# Patient Record
Sex: Female | Born: 1950
Health system: Southern US, Community
[De-identification: ages and names within clinical notes are randomized; demographics above are authoritative.]

## PROBLEM LIST (undated history)

## (undated) DIAGNOSIS — L309 Dermatitis, unspecified: Secondary | ICD-10-CM

## (undated) DIAGNOSIS — J449 Chronic obstructive pulmonary disease, unspecified: Secondary | ICD-10-CM

## (undated) DIAGNOSIS — K635 Polyp of colon: Secondary | ICD-10-CM

## (undated) DIAGNOSIS — I10 Essential (primary) hypertension: Secondary | ICD-10-CM

## (undated) DIAGNOSIS — J302 Other seasonal allergic rhinitis: Secondary | ICD-10-CM

## (undated) DIAGNOSIS — F419 Anxiety disorder, unspecified: Secondary | ICD-10-CM

## (undated) DIAGNOSIS — E785 Hyperlipidemia, unspecified: Secondary | ICD-10-CM

## (undated) HISTORY — DX: Polyp of colon: K63.5

## (undated) HISTORY — DX: Hyperlipidemia, unspecified: E78.5

## (undated) HISTORY — DX: Dermatitis, unspecified: L30.9

## (undated) HISTORY — PX: VAGINAL HYSTERECTOMY: SUR661

---

## 2000-10-17 ENCOUNTER — Other Ambulatory Visit: Admission: RE | Admit: 2000-10-17 | Discharge: 2000-10-17 | Payer: Self-pay | Admitting: Internal Medicine

## 2000-11-09 ENCOUNTER — Encounter: Payer: Self-pay | Admitting: Internal Medicine

## 2000-11-09 ENCOUNTER — Ambulatory Visit (HOSPITAL_COMMUNITY): Admission: RE | Admit: 2000-11-09 | Discharge: 2000-11-09 | Payer: Self-pay | Admitting: Internal Medicine

## 2005-01-04 ENCOUNTER — Emergency Department (HOSPITAL_COMMUNITY): Admission: EM | Admit: 2005-01-04 | Discharge: 2005-01-04 | Payer: Self-pay | Admitting: Emergency Medicine

## 2006-08-24 ENCOUNTER — Ambulatory Visit (HOSPITAL_COMMUNITY): Admission: RE | Admit: 2006-08-24 | Discharge: 2006-08-24 | Payer: Self-pay | Admitting: Internal Medicine

## 2006-09-26 ENCOUNTER — Ambulatory Visit: Payer: Self-pay | Admitting: Internal Medicine

## 2006-09-26 ENCOUNTER — Encounter: Payer: Self-pay | Admitting: Internal Medicine

## 2006-09-26 ENCOUNTER — Ambulatory Visit (HOSPITAL_COMMUNITY): Admission: RE | Admit: 2006-09-26 | Discharge: 2006-09-26 | Payer: Self-pay | Admitting: Internal Medicine

## 2006-09-26 HISTORY — PX: COLONOSCOPY: SHX174

## 2007-09-27 ENCOUNTER — Ambulatory Visit (HOSPITAL_COMMUNITY): Admission: RE | Admit: 2007-09-27 | Discharge: 2007-09-27 | Payer: Self-pay | Admitting: Internal Medicine

## 2008-10-03 ENCOUNTER — Ambulatory Visit (HOSPITAL_COMMUNITY): Admission: RE | Admit: 2008-10-03 | Discharge: 2008-10-03 | Payer: Self-pay | Admitting: Internal Medicine

## 2009-06-29 ENCOUNTER — Ambulatory Visit (HOSPITAL_COMMUNITY): Admission: RE | Admit: 2009-06-29 | Discharge: 2009-06-29 | Payer: Self-pay | Admitting: Internal Medicine

## 2009-10-12 ENCOUNTER — Ambulatory Visit (HOSPITAL_COMMUNITY): Admission: RE | Admit: 2009-10-12 | Discharge: 2009-10-12 | Payer: Self-pay | Admitting: Internal Medicine

## 2010-05-23 ENCOUNTER — Encounter: Payer: Self-pay | Admitting: Internal Medicine

## 2010-09-17 NOTE — Op Note (Signed)
NAME:  Kimberly Bowen, Kimberly Bowen                ACCOUNT NO.:  [id number]   MEDICAL RECORD NO.:  [id number]          PATIENT TYPE:  AMB   LOCATION:  DAY                           FACILITY:  [facility]   PHYSICIAN:  R. Roetta Sessions, M.D. DATE OF BIRTH:  03-29-1951   DATE OF PROCEDURE:  09/26/2006  DATE OF DISCHARGE:                               OPERATIVE REPORT   PROCEDURE:  Colonoscopy with polyp ablation, biopsy, snare polypectomy  and EGD.   INDICATIONS FOR PROCEDURE:  A 60 year old Caucasian female, with no  lower GI tract symptoms, sent over, courtesy of Dr. Jena Gauss for colorectal  cancer screening.  There is a positive family history, only a second-  degree relative (paternal grandmother).  She has never had her lower GI  tract evaluated.  Colonoscopy is now being done.  This was discussed  with the patient at length.  The including potential risks, benefits and  alternatives have been reviewed, questions answered.  She is agreeable.  Please see document in the patient's medical record.   PROCEDURE NOTE:  O2 saturation, blood pressure, pulse and respirations  were monitor throughout the entire procedure.  Conscious sedation with  Versed 4 mg IV and Demerol 100 mg IV in divided doses.  Cetacaine   Dictation ended at this point      R. Roetta Sessions, M.D.  Electronically Signed     RMR/MEDQ  D:  09/26/2006  T:  09/26/2006  Job:  [id number]

## 2010-09-17 NOTE — Op Note (Signed)
NAME:  Kimberly Bowen, Kimberly Bowen                ACCOUNT NO.:  [id number]   MEDICAL RECORD NO.:  [id number]          PATIENT TYPE:  AMB   LOCATION:  DAY                           FACILITY:  [facility]   PHYSICIAN:  R. Roetta Sessions, M.D. DATE OF BIRTH:  10-24-1950   DATE OF PROCEDURE:  DATE OF DISCHARGE:                               OPERATIVE REPORT   INDICATIONS FOR PROCEDURE:  A 60 year old lady with no lower GI tract  symptoms, sent over with courtesy of Dr. Ouida Bowen for colorectal cancer  screening.  She has no lower GI tract symptoms.  There is a positive  family history only in a second degree relative (maternal grandmother).  She has never had a lower GI tract imaged.  Colonoscopy is now being  done by screening maneuver. The approach has been discussed with the  patient at length.  Please see documentation in the medical record.   PROCEDURE NOTE:  O2 saturation, blood pressure, pulse and respiration  were monitored for the entire procedure.  Conscious sedation of Versed 4  mg IV, Demerol 100 mg IV divided doses.  Instrument: Pentax video chip  system.   FINDINGS:  Digital rectal exam revealed no abnormalities.  Endoscopic  findings:  The prep was adequate. Colon:  The colonic mucosa was  surveyed from rectosigmoid junction through the left transverse right  colon to the appendiceal orifice to the ileocecal valve and cecum.  These structures were well seen and photographed for the record.  From  this level, the scope was slowly withdrawn.  All previously mentioned  mucosal surfaces were again seen.  The patient had pancolonic  diverticula and multiple colonic polyps.  There were three diminutive  polyps in the cecum which were cold biopsied.  There were multiple 6 mm  pedunculated hyperplastic-appearing polyps throughout the colon located  in the ascending hepatic flexure and descending segments.  They were all  either hot or cold snared.  A couple in the descending colon smaller  ones were  ablated with the tip of the hot snare unit.  The remainder of  the colonic mucosa appeared normal.  Multiple polyps were recovered for  the pathologist. The scope was pulled down to the rectum, where thorough  examination the rectal mucosa, including retroflexed view of the anal  verge, demonstrated no abnormalities.  The patient tolerated the  procedure well and was __________ in endoscopy.   IMPRESSION:  1. Normal rectum, pan colonic diverticula.  2. Multiple colonic polyps removed and/or destroyed, as described      above.   RECOMMENDATIONS:  1. Diverticulosis, polyp literature provided to Ms. Kimberly Bowen. No aspirin      or __________ medications for 10 days.  2. Follow up on path.  3. Further recommendations to follow.      Kimberly Bowen, M.D.  Electronically Signed     RMR/MEDQ  D:  09/26/2006  T:  09/26/2006  Job:  [id number]   cc:   Kimberly Bowen. Kimberly Sills, MD  Fax: [phone]

## 2011-09-28 ENCOUNTER — Encounter: Payer: Self-pay | Admitting: Internal Medicine

## 2011-11-29 ENCOUNTER — Other Ambulatory Visit (HOSPITAL_COMMUNITY): Payer: Self-pay | Admitting: Internal Medicine

## 2011-11-29 DIAGNOSIS — Z139 Encounter for screening, unspecified: Secondary | ICD-10-CM

## 2011-12-05 ENCOUNTER — Ambulatory Visit (HOSPITAL_COMMUNITY)
Admission: RE | Admit: 2011-12-05 | Discharge: 2011-12-05 | Disposition: A | Discharge status: Home / Self Care | Payer: 59 | Source: Ambulatory Visit | Attending: Internal Medicine | Admitting: Internal Medicine

## 2011-12-05 DIAGNOSIS — Z1231 Encounter for screening mammogram for malignant neoplasm of breast: Secondary | ICD-10-CM | POA: Insufficient documentation

## 2011-12-05 DIAGNOSIS — Z139 Encounter for screening, unspecified: Secondary | ICD-10-CM

## 2011-12-05 IMAGING — MG MM DIGITAL SCREENING BILAT W/ CAD
4 series · 4 of 4 positions shown · non-contrast
Comparison: Previous exams.

CLINICAL DATA: Screening.

DIGITAL SCREENING MAMMOGRAM WITH CAD

[L CC]
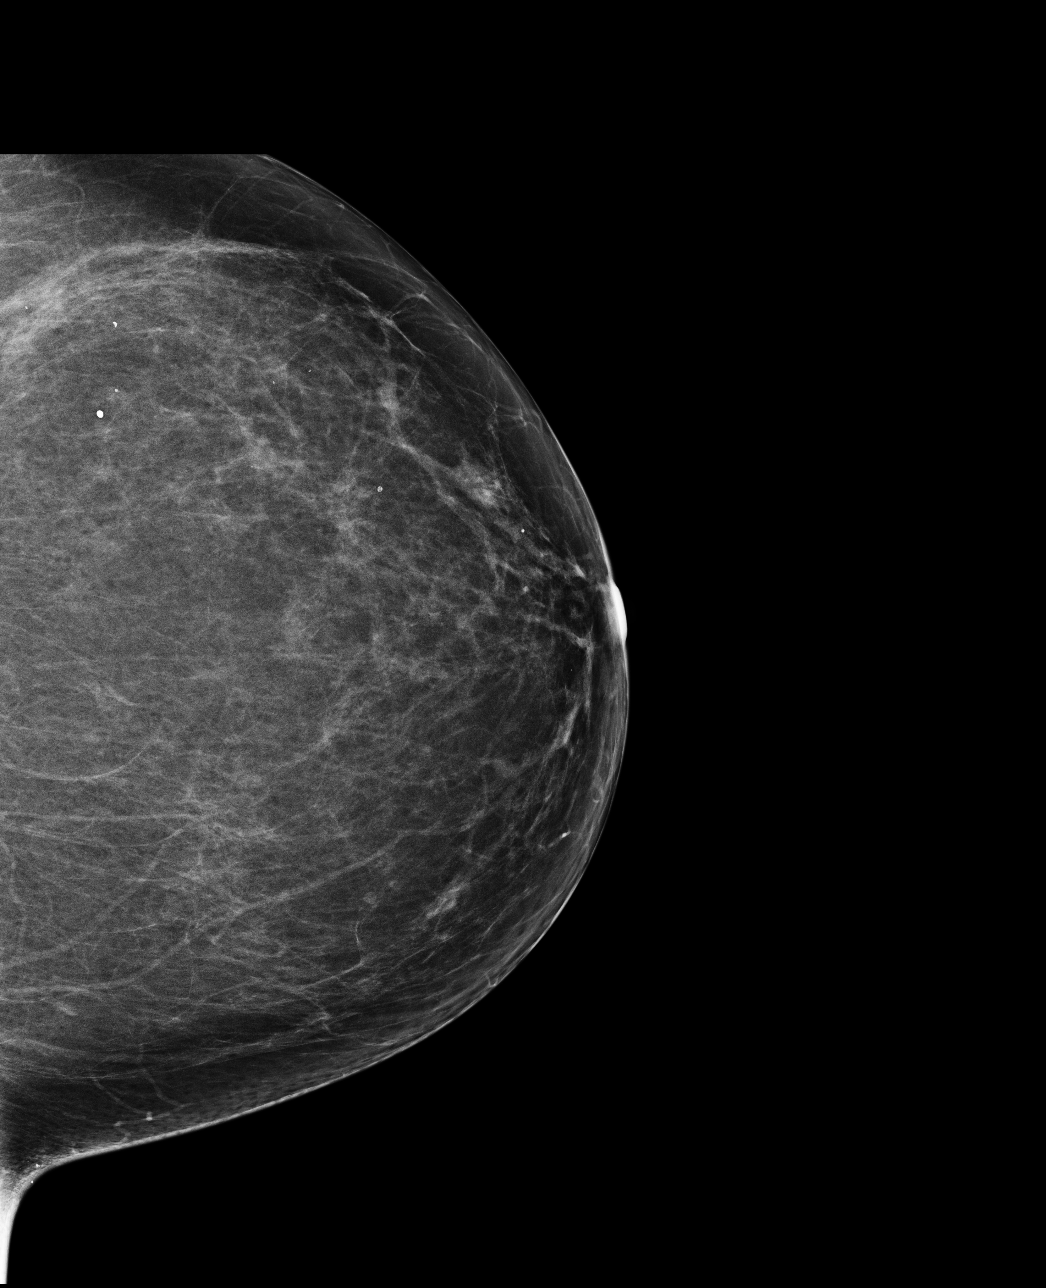

[L MLO]
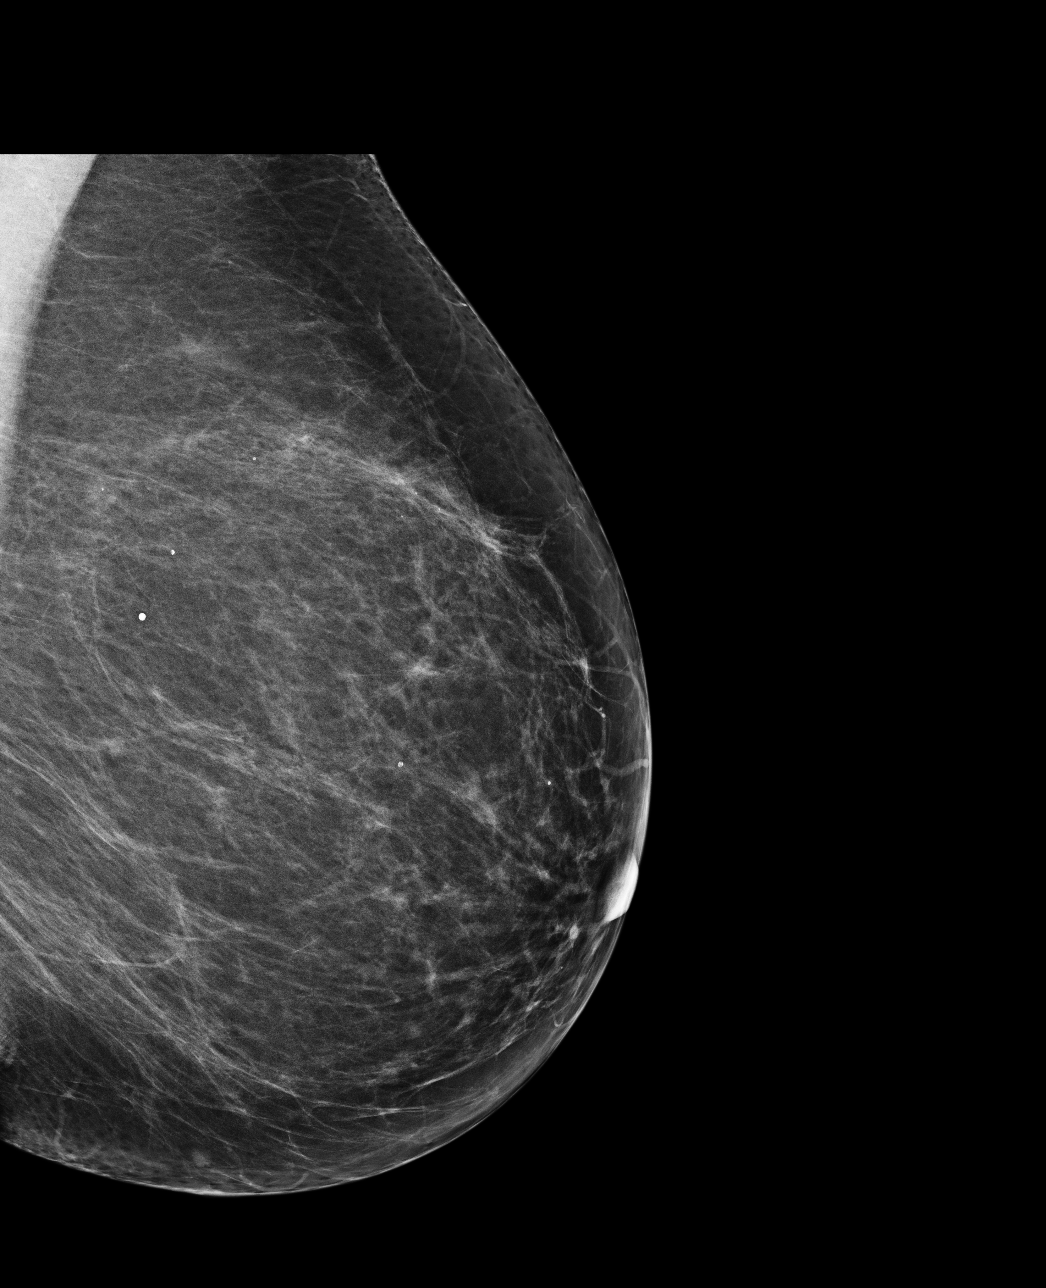

[R CC]
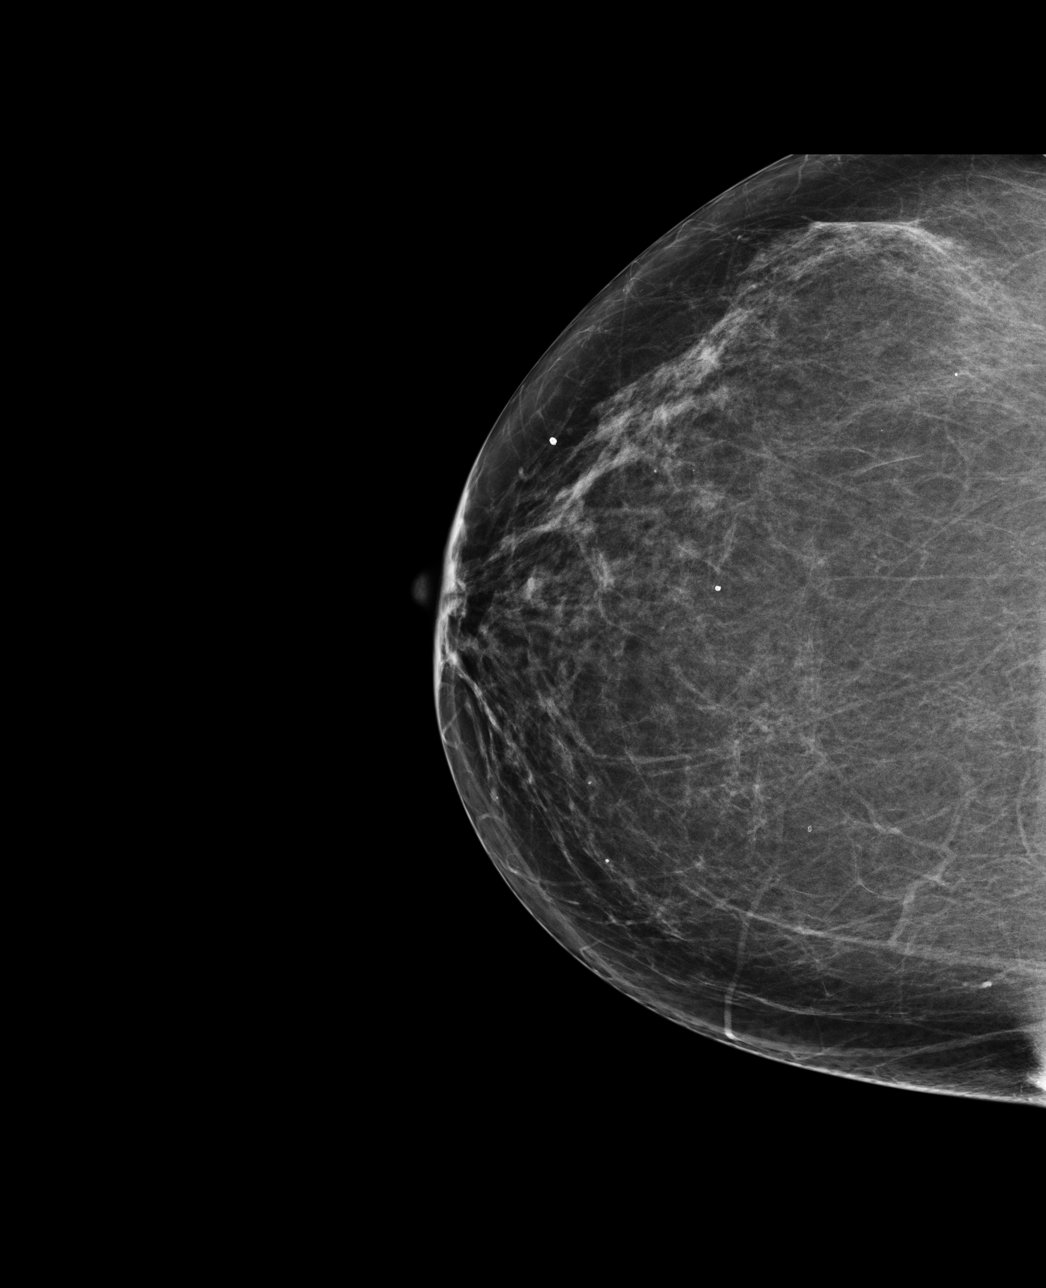

[R MLO]
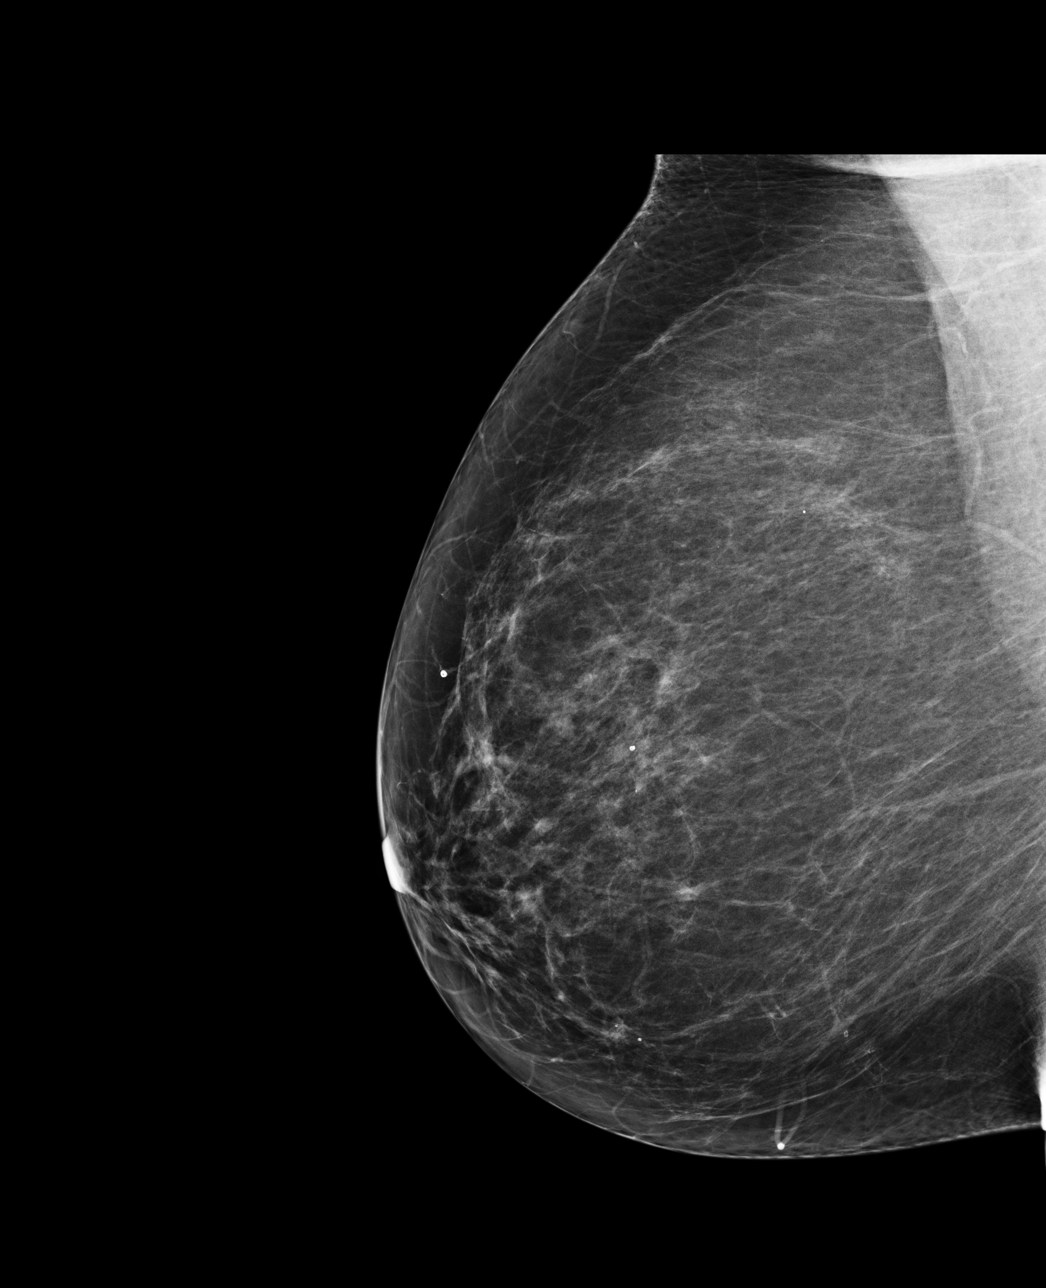

[4 of 4 positions shown; findings below may reference images not displayed]

FINDINGS: There are scattered fibroglandular densities. No
suspicious masses, architectural distortion, or calcifications are
present.

Images were processed with CAD.
IMPRESSION: No mammographic evidence of malignancy.

A result letter of this screening mammogram will be mailed directly
to the patient.

RECOMMENDATION:
Screening mammogram in one year. (Code:[YH])

BI-RADS CATEGORY 1:  Negative.

## 2013-09-26 ENCOUNTER — Other Ambulatory Visit (HOSPITAL_COMMUNITY): Payer: Self-pay | Admitting: Internal Medicine

## 2013-09-26 DIAGNOSIS — IMO0002 Reserved for concepts with insufficient information to code with codable children: Secondary | ICD-10-CM

## 2013-09-30 ENCOUNTER — Ambulatory Visit (HOSPITAL_COMMUNITY)
Admission: RE | Admit: 2013-09-30 | Discharge: 2013-09-30 | Disposition: A | Discharge status: Home / Self Care | Payer: 59 | Source: Ambulatory Visit | Attending: Internal Medicine | Admitting: Internal Medicine

## 2013-09-30 ENCOUNTER — Ambulatory Visit (HOSPITAL_COMMUNITY): Payer: 59

## 2013-09-30 DIAGNOSIS — IMO0002 Reserved for concepts with insufficient information to code with codable children: Secondary | ICD-10-CM

## 2013-09-30 DIAGNOSIS — M47817 Spondylosis without myelopathy or radiculopathy, lumbosacral region: Secondary | ICD-10-CM | POA: Insufficient documentation

## 2013-12-02 ENCOUNTER — Other Ambulatory Visit (HOSPITAL_COMMUNITY): Payer: Self-pay | Admitting: Internal Medicine

## 2013-12-02 DIAGNOSIS — Z1231 Encounter for screening mammogram for malignant neoplasm of breast: Secondary | ICD-10-CM

## 2013-12-05 ENCOUNTER — Encounter: Payer: Self-pay | Admitting: Internal Medicine

## 2013-12-09 ENCOUNTER — Ambulatory Visit (HOSPITAL_COMMUNITY)
Admission: RE | Admit: 2013-12-09 | Discharge: 2013-12-09 | Disposition: A | Discharge status: Home / Self Care | Payer: 59 | Source: Ambulatory Visit | Attending: Internal Medicine | Admitting: Internal Medicine

## 2013-12-09 DIAGNOSIS — Z1231 Encounter for screening mammogram for malignant neoplasm of breast: Secondary | ICD-10-CM | POA: Insufficient documentation

## 2014-01-02 ENCOUNTER — Ambulatory Visit: Payer: 59 | Admitting: Gastroenterology

## 2014-01-22 ENCOUNTER — Ambulatory Visit (INDEPENDENT_AMBULATORY_CARE_PROVIDER_SITE_OTHER): Payer: 59 | Admitting: Gastroenterology

## 2014-01-22 ENCOUNTER — Other Ambulatory Visit: Payer: Self-pay

## 2014-01-22 ENCOUNTER — Encounter: Payer: Self-pay | Admitting: Gastroenterology

## 2014-01-22 ENCOUNTER — Encounter (INDEPENDENT_AMBULATORY_CARE_PROVIDER_SITE_OTHER): Payer: Self-pay

## 2014-01-22 VITALS — BP 140/80 | HR 76 | Temp 97.5°F | Ht 63.0 in | Wt 186.0 lb

## 2014-01-22 DIAGNOSIS — Z860100 Personal history of colon polyps, unspecified: Secondary | ICD-10-CM

## 2014-01-22 DIAGNOSIS — Z800 Family history of malignant neoplasm of digestive organs: Secondary | ICD-10-CM

## 2014-01-22 MED ORDER — PEG-KCL-NACL-NASULF-NA ASC-C 100 G PO SOLR: 1.0000 | ORAL | Status: DC

## 2014-01-22 NOTE — Progress Notes (Signed)
Primary Care Physician:  Asencion Noble, MD  Primary Gastroenterologist: Garfield Cornea, MD    Chief Complaint  Patient presents with  . Colonoscopy    HPI:  Kimberly Bowen is a 63 y.o. female here to schedule colonoscopy. Colonoscopy back in 2008 demonstrated numerous colonic polyps which were removed and/or destroyed. Pathology revealed multiple hyperplastic polyps. Family history significant for 1 second degree relative (maternal grandmother) who had colon cancer. She was advised to come back in 5 years for repeat colonoscopy.  Currently she denies constipation, diarrhea, melena, brbpr, GERD, dysphagia, vomiting, abdominal pain, unintentional weight loss.     Current Outpatient Prescriptions  Medication Sig Dispense Refill  . diphenhydrAMINE (BENADRYL) 25 MG tablet Take 25 mg by mouth every 6 (six) hours as needed.      . NON FORMULARY Store brand of Allegra  prn       No current facility-administered medications for this visit.    Allergies as of 01/22/2014  . (No Known Allergies)    Past Medical History  Diagnosis Date  . Hyperlipidemia     not on med  . Colon polyps     multiple hyperplastic polyps  . Eczema     Past Surgical History  Procedure Laterality Date  . Colonoscopy  09/26/2006      [id number] rectum, pan colonic diverticula/Multiple colonic polyps removed and/or destroyed, as described above (hyperplastic)  . Vaginal hysterectomy      Family History  Problem Relation Age of Onset  . Colon cancer Maternal Grandmother   . Leukemia Mother   . Lupus Sister   . Breast cancer Maternal Aunt     age 82  . Cancer Cousin     three first cousins died of cancer    History   Social History  . Marital Status: Married    Spouse Name: N/A    Number of Children: 2  . Years of Education: N/A   Occupational History  .  [employer]   Social History Main Topics  . Smoking status: Current Every Day Smoker  . Smokeless tobacco: Not on file     Comment: 1/2 pack  daily  . Alcohol Use: No  . Drug Use: No  . Sexual Activity: Not on file   Other Topics Concern  . Not on file   Social History Narrative  . No narrative on file      ROS:  General: Negative for anorexia, weight loss, fever, chills, fatigue, weakness. Eyes: Negative for vision changes.  ENT: Negative for hoarseness, difficulty swallowing , nasal congestion. CV: Negative for chest pain, angina, palpitations, dyspnea on exertion, peripheral edema.  Respiratory: Negative for dyspnea at rest, dyspnea on exertion, cough, sputum, wheezing.  GI: See history of present illness. GU:  Negative for dysuria, hematuria, urinary incontinence, urinary frequency, nocturnal urination.  MS: Negative for joint pain, low back pain.  Derm: Negative for rash or itching.  Neuro: Negative for weakness, abnormal sensation, seizure, frequent headaches, memory loss, confusion.  Psych: Negative for anxiety, depression, suicidal ideation, hallucinations.  Endo: Negative for unusual weight change.  Heme: Negative for bruising or bleeding. Allergy: Negative for rash or hives.    Physical Examination:  BP 140/80  Pulse 76  Temp(Src) 97.5 F (36.4 C) (Oral)  Ht 5\' 3"  (1.6 m)  Wt 186 lb (84.369 kg)  BMI 32.96 kg/m2   General: Well-nourished, well-developed in no acute distress.  Head: Normocephalic, atraumatic.   Eyes: Conjunctiva pink, no icterus. Mouth: Oropharyngeal mucosa moist and  pink , no lesions erythema or exudate. Neck: Supple without thyromegaly, masses, or lymphadenopathy.  Lungs: Clear to auscultation bilaterally.  Heart: Regular rate and rhythm, no murmurs rubs or gallops.  Abdomen: Bowel sounds are normal, nontender, nondistended, no hepatosplenomegaly or masses, no abdominal bruits or    hernia , no rebound or guarding.   Rectal: not performed Extremities: No lower extremity edema. No clubbing or deformities.  Neuro: Alert and oriented x 4 , grossly normal neurologically.  Skin:  Warm and dry, no rash or jaundice.   Psych: Alert and cooperative, normal mood and affect.  Labs: July 2015 White blood cell count 7400, hemoglobin 14.3, hematocrit 42.5, platelets 323,000, total bilirubin 0.5, alkaline phosphatase 85, AST 13, ALT 12, albumin 3.8, BUN 9, creatinine 0.7  Imaging Studies: No results found.

## 2014-01-22 NOTE — Patient Instructions (Signed)
1. Colonoscopy with Dr. Rourk. See separate instructions.  

## 2014-01-22 NOTE — Progress Notes (Signed)
cc'ed to pcp °

## 2014-01-22 NOTE — Assessment & Plan Note (Signed)
63 y/o female with history of numerous hyperplastic polyps at time of colonoscopy in 2008. FH of colon cancer in 1 second degree relative. Advised for follow up colonoscopy 2013 but never followed through. She presents now void of any lower GI symptoms. Colonoscopy in near future.  I have discussed the risks, alternatives, benefits with regards to but not limited to the risk of reaction to medication, bleeding, infection, perforation and the patient is agreeable to proceed. Written consent to be obtained.

## 2014-01-31 ENCOUNTER — Encounter (HOSPITAL_COMMUNITY): Payer: Self-pay | Admitting: Pharmacy Technician

## 2014-02-11 ENCOUNTER — Ambulatory Visit (HOSPITAL_COMMUNITY)
Admission: RE | Admit: 2014-02-11 | Discharge: 2014-02-11 | Disposition: A | Discharge status: Home / Self Care | Payer: 59 | Source: Ambulatory Visit | Attending: Internal Medicine | Admitting: Internal Medicine

## 2014-02-11 ENCOUNTER — Encounter (HOSPITAL_COMMUNITY): Payer: Self-pay | Admitting: *Deleted

## 2014-02-11 ENCOUNTER — Encounter (HOSPITAL_COMMUNITY)
Admission: RE | Disposition: A | Discharge status: Home / Self Care | Payer: Self-pay | Source: Ambulatory Visit | Attending: Internal Medicine

## 2014-02-11 DIAGNOSIS — Z800 Family history of malignant neoplasm of digestive organs: Secondary | ICD-10-CM | POA: Diagnosis not present

## 2014-02-11 DIAGNOSIS — F17210 Nicotine dependence, cigarettes, uncomplicated: Secondary | ICD-10-CM | POA: Insufficient documentation

## 2014-02-11 DIAGNOSIS — Z1211 Encounter for screening for malignant neoplasm of colon: Secondary | ICD-10-CM | POA: Diagnosis not present

## 2014-02-11 DIAGNOSIS — K5730 Diverticulosis of large intestine without perforation or abscess without bleeding: Secondary | ICD-10-CM | POA: Diagnosis not present

## 2014-02-11 DIAGNOSIS — E785 Hyperlipidemia, unspecified: Secondary | ICD-10-CM | POA: Diagnosis not present

## 2014-02-11 DIAGNOSIS — Z860100 Personal history of colon polyps, unspecified: Secondary | ICD-10-CM

## 2014-02-11 DIAGNOSIS — K635 Polyp of colon: Secondary | ICD-10-CM

## 2014-02-11 DIAGNOSIS — D124 Benign neoplasm of descending colon: Secondary | ICD-10-CM | POA: Insufficient documentation

## 2014-02-11 DIAGNOSIS — D125 Benign neoplasm of sigmoid colon: Secondary | ICD-10-CM | POA: Insufficient documentation

## 2014-02-11 HISTORY — PX: COLONOSCOPY: SHX5424

## 2014-02-11 SURGERY — COLONOSCOPY
Anesthesia: Moderate Sedation

## 2014-02-11 MED ORDER — ONDANSETRON HCL 4 MG/2ML IJ SOLN
INTRAMUSCULAR | Status: DC
Start: 2014-02-11 — End: 2014-02-11
  Filled 2014-02-11: qty 2

## 2014-02-11 MED ORDER — ONDANSETRON HCL 4 MG/2ML IJ SOLN
INTRAMUSCULAR | Status: DC | PRN
  Administered 2014-02-11: 4 mg via INTRAVENOUS

## 2014-02-11 MED ORDER — MEPERIDINE HCL 100 MG/ML IJ SOLN
INTRAMUSCULAR | Status: AC
  Filled 2014-02-11: qty 2

## 2014-02-11 MED ORDER — MEPERIDINE HCL 100 MG/ML IJ SOLN
INTRAMUSCULAR | Status: DC | PRN
  Administered 2014-02-11: 50 mg via INTRAVENOUS
  Administered 2014-02-11: 25 mg via INTRAVENOUS

## 2014-02-11 MED ORDER — STERILE WATER FOR IRRIGATION IR SOLN
Status: DC | PRN
  Administered 2014-02-11: 15:00:00

## 2014-02-11 MED ORDER — MIDAZOLAM HCL 5 MG/5ML IJ SOLN
INTRAMUSCULAR | Status: DC | PRN
  Administered 2014-02-11 (×3): 1 mg via INTRAVENOUS
  Administered 2014-02-11: 2 mg via INTRAVENOUS
  Administered 2014-02-11: 1 mg via INTRAVENOUS

## 2014-02-11 MED ORDER — MIDAZOLAM HCL 5 MG/5ML IJ SOLN
INTRAMUSCULAR | Status: AC
  Filled 2014-02-11: qty 10

## 2014-02-11 MED ORDER — SODIUM CHLORIDE 0.9 % IV SOLN
INTRAVENOUS | Status: DC
  Administered 2014-02-11: 14:00:00 via INTRAVENOUS

## 2014-02-11 NOTE — H&P (View-Only) (Signed)
Primary Care Physician:  Asencion Noble, MD  Primary Gastroenterologist: Garfield Cornea, MD    Chief Complaint  Patient presents with  . Colonoscopy    HPI:  Kimberly Bowen is a 63 y.o. female here to schedule colonoscopy. Colonoscopy back in 2008 demonstrated numerous colonic polyps which were removed and/or destroyed. Pathology revealed multiple hyperplastic polyps. Family history significant for 1 second degree relative (maternal grandmother) who had colon cancer. She was advised to come back in 5 years for repeat colonoscopy.  Currently she denies constipation, diarrhea, melena, brbpr, GERD, dysphagia, vomiting, abdominal pain, unintentional weight loss.     Current Outpatient Prescriptions  Medication Sig Dispense Refill  . diphenhydrAMINE (BENADRYL) 25 MG tablet Take 25 mg by mouth every 6 (six) hours as needed.      . NON FORMULARY Store brand of Allegra  prn       No current facility-administered medications for this visit.    Allergies as of 01/22/2014  . (No Known Allergies)    Past Medical History  Diagnosis Date  . Hyperlipidemia     not on med  . Colon polyps     multiple hyperplastic polyps  . Eczema     Past Surgical History  Procedure Laterality Date  . Colonoscopy  09/26/2006      [id number] rectum, pan colonic diverticula/Multiple colonic polyps removed and/or destroyed, as described above (hyperplastic)  . Vaginal hysterectomy      Family History  Problem Relation Age of Onset  . Colon cancer Maternal Grandmother   . Leukemia Mother   . Lupus Sister   . Breast cancer Maternal Aunt     age 66  . Cancer Cousin     three first cousins died of cancer    History   Social History  . Marital Status: Married    Spouse Name: N/A    Number of Children: 2  . Years of Education: N/A   Occupational History  .  [employer]   Social History Main Topics  . Smoking status: Current Every Day Smoker  . Smokeless tobacco: Not on file     Comment: 1/2 pack  daily  . Alcohol Use: No  . Drug Use: No  . Sexual Activity: Not on file   Other Topics Concern  . Not on file   Social History Narrative  . No narrative on file      ROS:  General: Negative for anorexia, weight loss, fever, chills, fatigue, weakness. Eyes: Negative for vision changes.  ENT: Negative for hoarseness, difficulty swallowing , nasal congestion. CV: Negative for chest pain, angina, palpitations, dyspnea on exertion, peripheral edema.  Respiratory: Negative for dyspnea at rest, dyspnea on exertion, cough, sputum, wheezing.  GI: See history of present illness. GU:  Negative for dysuria, hematuria, urinary incontinence, urinary frequency, nocturnal urination.  MS: Negative for joint pain, low back pain.  Derm: Negative for rash or itching.  Neuro: Negative for weakness, abnormal sensation, seizure, frequent headaches, memory loss, confusion.  Psych: Negative for anxiety, depression, suicidal ideation, hallucinations.  Endo: Negative for unusual weight change.  Heme: Negative for bruising or bleeding. Allergy: Negative for rash or hives.    Physical Examination:  BP 140/80  Pulse 76  Temp(Src) 97.5 F (36.4 C) (Oral)  Ht 5\' 3"  (1.6 m)  Wt 186 lb (84.369 kg)  BMI 32.96 kg/m2   General: Well-nourished, well-developed in no acute distress.  Head: Normocephalic, atraumatic.   Eyes: Conjunctiva pink, no icterus. Mouth: Oropharyngeal mucosa moist and  pink , no lesions erythema or exudate. Neck: Supple without thyromegaly, masses, or lymphadenopathy.  Lungs: Clear to auscultation bilaterally.  Heart: Regular rate and rhythm, no murmurs rubs or gallops.  Abdomen: Bowel sounds are normal, nontender, nondistended, no hepatosplenomegaly or masses, no abdominal bruits or    hernia , no rebound or guarding.   Rectal: not performed Extremities: No lower extremity edema. No clubbing or deformities.  Neuro: Alert and oriented x 4 , grossly normal neurologically.  Skin:  Warm and dry, no rash or jaundice.   Psych: Alert and cooperative, normal mood and affect.  Labs: July 2015 White blood cell count 7400, hemoglobin 14.3, hematocrit 42.5, platelets 323,000, total bilirubin 0.5, alkaline phosphatase 85, AST 13, ALT 12, albumin 3.8, BUN 9, creatinine 0.7  Imaging Studies: No results found.

## 2014-02-11 NOTE — Interval H&P Note (Signed)
History and Physical Interval Note:  02/11/2014 3:25 PM  Kimberly Bowen  has presented today for surgery, with the diagnosis of history of colonic polyps  The various methods of treatment have been discussed with the patient and family. After consideration of risks, benefits and other options for treatment, the patient has consented to  Procedure(s) with comments: COLONOSCOPY (N/A) - 1:00  as a surgical intervention .  The patient's history has been reviewed, patient examined, no change in status, stable for surgery.  I have reviewed the patient's chart and labs.  Questions were answered to the patient's satisfaction.     Rahiem Schellinger  No change. Colonoscopy per plan.The risks, benefits, limitations, alternatives and imponderables have been reviewed with the patient. Questions have been answered. All parties are agreeable.

## 2014-02-11 NOTE — Discharge Instructions (Addendum)
°Colonoscopy °Discharge Instructions ° °Read the instructions outlined below and refer to this sheet in the next few weeks. These discharge instructions provide you with general information on caring for yourself after you leave the hospital. Your doctor may also give you specific instructions. While your treatment has been planned according to the most current medical practices available, unavoidable complications occasionally occur. If you have any problems or questions after discharge, call Dr. Rourk at [phone]. °ACTIVITY °· You may resume your regular activity, but move at a slower pace for the next 24 hours.  °· Take frequent rest periods for the next 24 hours.  °· Walking will help get rid of the air and reduce the bloated feeling in your belly (abdomen).  °· No driving for 24 hours (because of the medicine (anesthesia) used during the test).   °· Do not sign any important legal documents or operate any machinery for 24 hours (because of the anesthesia used during the test).  °NUTRITION °· Drink plenty of fluids.  °· You may resume your normal diet as instructed by your doctor.  °· Begin with a light meal and progress to your normal diet. Heavy or fried foods are harder to digest and may make you feel sick to your stomach (nauseated).  °· Avoid alcoholic beverages for 24 hours or as instructed.  °MEDICATIONS °· You may resume your normal medications unless your doctor tells you otherwise.  °WHAT YOU CAN EXPECT TODAY °· Some feelings of bloating in the abdomen.  °· Passage of more gas than usual.  °· Spotting of blood in your stool or on the toilet paper.  °IF YOU HAD POLYPS REMOVED DURING THE COLONOSCOPY: °· No aspirin products for 7 days or as instructed.  °· No alcohol for 7 days or as instructed.  °· Eat a soft diet for the next 24 hours.  °FINDING OUT THE RESULTS OF YOUR TEST °Not all test results are available during your visit. If your test results are not back during the visit, make an appointment  with your caregiver to find out the results. Do not assume everything is normal if you have not heard from your caregiver or the medical facility. It is important for you to follow up on all of your test results.  °SEEK IMMEDIATE MEDICAL ATTENTION IF: °· You have more than a spotting of blood in your stool.  °· Your belly is swollen (abdominal distention).  °· You are nauseated or vomiting.  °· You have a temperature over 101.  °· You have abdominal pain or discomfort that is severe or gets worse throughout the day.  ° °Diverticulosis and polyp information provided ° °Further recommendations to follow pending review of pathology report ° ° °Diverticulosis °Diverticulosis is the condition that develops when small pouches (diverticula) form in the wall of your colon. Your colon, or large intestine, is where water is absorbed and stool is formed. The pouches form when the inside layer of your colon pushes through weak spots in the outer layers of your colon. °CAUSES  °No one knows exactly what causes diverticulosis. °RISK FACTORS °· Being older than 50. Your risk for this condition increases with age. Diverticulosis is rare in people younger than 40 years. By age 80, almost everyone has it. °· Eating a low-fiber diet. °· Being frequently constipated. °· Being overweight. °· Not getting enough exercise. °· Smoking. °· Taking over-the-counter pain medicines, like aspirin and ibuprofen. °SYMPTOMS  °Most people with diverticulosis do not have symptoms. °DIAGNOSIS  °Because   diverticulosis often has no symptoms, health care providers often discover the condition during an exam for other colon problems. In many cases, a health care provider will diagnose diverticulosis while using a flexible scope to examine the colon (colonoscopy). °TREATMENT  °If you have never developed an infection related to diverticulosis, you may not need treatment. If you have had an infection before, treatment may include: °· Eating more fruits,  vegetables, and grains. °· Taking a fiber supplement. °· Taking a live bacteria supplement (probiotic). °· Taking medicine to relax your colon. °HOME CARE INSTRUCTIONS  °· Drink at least 6-8 glasses of water each day to prevent constipation. °· Try not to strain when you have a bowel movement. °· Keep all follow-up appointments. °If you have had an infection before:  °· Increase the fiber in your diet as directed by your health care provider or dietitian. °· Take a dietary fiber supplement if your health care provider approves. °· Only take medicines as directed by your health care provider. °SEEK MEDICAL CARE IF:  °· You have abdominal pain. °· You have bloating. °· You have cramps. °· You have not gone to the bathroom in 3 days. °SEEK IMMEDIATE MEDICAL CARE IF:  °· Your pain gets worse. °· Your bloating becomes very bad. °· You have a fever or chills, and your symptoms suddenly get worse. °· You begin vomiting. °· You have bowel movements that are bloody or black. °MAKE SURE YOU: °· Understand these instructions. °· Will watch your condition. °· Will get help right away if you are not doing well or get worse. °Document Released: 01/14/2004 Document Revised: 04/23/2013 Document Reviewed: 03/13/2013 °[employer]® Patient Information ©2015 [employer]. This information is not intended to replace advice given to you by your health care provider. Make sure you discuss any questions you have with your health care provider. ° ° °Colon Polyps °Polyps are lumps of extra tissue growing inside the body. Polyps can grow in the large intestine (colon). Most colon polyps are noncancerous (benign). However, some colon polyps can become cancerous over time. Polyps that are larger than a pea may be harmful. To be safe, caregivers remove and test all polyps. °CAUSES  °Polyps form when mutations in the genes cause your cells to grow and divide even though no more tissue is needed. °RISK FACTORS °There are a number of risk factors  that can increase your chances of getting colon polyps. They include: °· Being older than 50 years. °· Family history of colon polyps or colon cancer. °· Long-term colon diseases, such as colitis or Crohn disease. °· Being overweight. °· Smoking. °· Being inactive. °· Drinking too much alcohol. °SYMPTOMS  °Most small polyps do not cause symptoms. If symptoms are present, they may include: °· Blood in the stool. The stool may look dark red or black. °· Constipation or diarrhea that lasts longer than 1 week. °DIAGNOSIS °People often do not know they have polyps until their caregiver finds them during a regular checkup. Your caregiver can use 4 tests to check for polyps: °· Digital rectal exam. The caregiver wears gloves and feels inside the rectum. This test would find polyps only in the rectum. °· Barium enema. The caregiver puts a liquid called barium into your rectum before taking X-rays of your colon. Barium makes your colon look white. Polyps are dark, so they are easy to see in the X-ray pictures. °· Sigmoidoscopy. A thin, flexible tube (sigmoidoscope) is placed into your rectum. The sigmoidoscope has a light and tiny camera   in it. The caregiver uses the sigmoidoscope to look at the last third of your colon. °· Colonoscopy. This test is like sigmoidoscopy, but the caregiver looks at the entire colon. This is the most common method for finding and removing polyps. °TREATMENT  °Any polyps will be removed during a sigmoidoscopy or colonoscopy. The polyps are then tested for cancer. °PREVENTION  °To help lower your risk of getting more colon polyps: °· Eat plenty of fruits and vegetables. Avoid eating fatty foods. °· Do not smoke. °· Avoid drinking alcohol. °· Exercise every day. °· Lose weight if recommended by your caregiver. °· Eat plenty of calcium and folate. Foods that are rich in calcium include milk, cheese, and broccoli. Foods that are rich in folate include chickpeas, kidney beans, and spinach. °HOME CARE  INSTRUCTIONS °Keep all follow-up appointments as directed by your caregiver. You may need periodic exams to check for polyps. °SEEK MEDICAL CARE IF: °You notice bleeding during a bowel movement. °Document Released: 01/13/2004 Document Revised: 07/11/2011 Document Reviewed: 06/28/2011 °Exit[employer]® Patient Information ©2015 [employer]. This information is not intended to replace advice given to you by your health care provider. Make sure you discuss any questions you have with your health care provider. ° °

## 2014-02-11 NOTE — Op Note (Signed)
[facility] [address] [place], [address]   COLONOSCOPY PROCEDURE REPORT  PATIENT: Arlina, Sabina  MR#: [id number] BIRTHDATE: 17-Nov-1950 , 49  yrs. old GENDER: female ENDOSCOPIST: R.  Garfield Cornea, MD FACP [facility] REFERRED BY:Roy Willey Blade, M.D. PROCEDURE DATE:  03/02/2014 PROCEDURE:   Colonoscopy, with multiple snare polypectomies INDICATIONS:average risk for colorectal cancer. MEDICATIONS: Versed 6 mg IV and Demerol 75 mg IV in divided doses. Zofran 4 mg IV. ASA CLASS:       Class II  CONSENT: The risks, benefits, alternatives and imponderables including but not limited to bleeding, perforation as well as the possibility of a missed lesion have been reviewed.  The potential for biopsy, lesion removal, etc. have also been discussed. Questions have been answered.  All parties agreeable.  Please see the history and physical in the medical record for more information.  DESCRIPTION OF PROCEDURE:   After the risks benefits and alternatives of the procedure were thoroughly explained, informed consent was obtained.  The digital rectal exam revealed no abnormalities of the rectum.   The EC-3890Li ([id number])  endoscope was introduced through the anus and advanced to the cecum, which was identified by both the appendix and ileocecal valve. No adverse events experienced.   The quality of the prep was adequate.  The instrument was then slowly withdrawn as the colon was fully examined.      COLON FINDINGS: (2) 6 millimeter polyps in the sigmoid segment and (1) 5 mm polyp in the mid descending segment; otherwise, the remainder of the colonic mucosa appeared normal aside from pancolonic diverticula.  Retroflexed views revealed no abnormalities. .  The above-mentioned polyps were all hot mood except for one the sigmoid polyp was cold snare removed.  Withdrawal time=16 minutes 0 seconds.  The scope was withdrawn and the procedure completed. COMPLICATIONS: There were no  immediate complications.  ENDOSCOPIC IMPRESSION: Multiple colonic polyps-removed as described above.  Pancolonic diverticulosis  RECOMMENDATIONS: Follow up on pathology and  eSigned:  R. Garfield Cornea, MD Rosalita Chessman [facility] March 02, 2014 4:15 PM   cc:  CPT CODES: ICD CODES:  The ICD and CPT codes recommended by this software are interpretations from the data that the clinical staff has captured with the software.  The verification of the translation of this report to the ICD and CPT codes and modifiers is the sole responsibility of the health care institution and practicing physician where this report was generated.  [employer] will not be held responsible for the validity of the ICD and CPT codes included on this report.  AMA assumes no liability for data contained or not contained herein. CPT is a Designer, television/film set of the [employer].  PATIENT NAME:  Coila, Wardell MR#: [id number]

## 2014-02-12 ENCOUNTER — Encounter (HOSPITAL_COMMUNITY): Payer: Self-pay | Admitting: Internal Medicine

## 2014-02-16 ENCOUNTER — Encounter: Payer: Self-pay | Admitting: Internal Medicine

## 2015-12-15 ENCOUNTER — Encounter (HOSPITAL_COMMUNITY): Payer: Self-pay | Admitting: *Deleted

## 2015-12-15 ENCOUNTER — Emergency Department (HOSPITAL_COMMUNITY)
Admission: EM | Admit: 2015-12-15 | Discharge: 2015-12-15 | Disposition: A | Discharge status: Home / Self Care | Payer: 59 | Attending: Emergency Medicine | Admitting: Emergency Medicine

## 2015-12-15 ENCOUNTER — Emergency Department (HOSPITAL_COMMUNITY): Payer: 59

## 2015-12-15 DIAGNOSIS — F17210 Nicotine dependence, cigarettes, uncomplicated: Secondary | ICD-10-CM | POA: Diagnosis not present

## 2015-12-15 DIAGNOSIS — R220 Localized swelling, mass and lump, head: Secondary | ICD-10-CM | POA: Diagnosis present

## 2015-12-15 DIAGNOSIS — K1120 Sialoadenitis, unspecified: Secondary | ICD-10-CM | POA: Insufficient documentation

## 2015-12-15 LAB — I-STAT CHEM 8, ED
BUN: 7 mg/dL (ref 6–20)
CALCIUM ION: 1.18 mmol/L (ref 1.12–1.23)
CREATININE: 0.7 mg/dL (ref 0.44–1.00)
Chloride: 105 mmol/L (ref 101–111)
GLUCOSE: 115 mg/dL — AB (ref 65–99)
HCT: 46 % (ref 36.0–46.0)
HEMOGLOBIN: 15.6 g/dL — AB (ref 12.0–15.0)
Potassium: 3.7 mmol/L (ref 3.5–5.1)
Sodium: 144 mmol/L (ref 135–145)
TCO2: 24 mmol/L (ref 0–100)

## 2015-12-15 LAB — I-STAT CREATININE, ED: CREATININE: 0.7 mg/dL (ref 0.44–1.00)

## 2015-12-15 MED ORDER — IOPAMIDOL (ISOVUE-300) INJECTION 61%
75.0000 mL | Freq: Once | INTRAVENOUS | Status: AC | PRN
  Administered 2015-12-15: 75 mL via INTRAVENOUS

## 2015-12-15 MED ORDER — AMOXICILLIN-POT CLAVULANATE 875-125 MG PO TABS: 1.0000 | ORAL_TABLET | Freq: Two times a day (BID) | ORAL | 0 refills | Status: DC

## 2015-12-15 NOTE — ED Notes (Signed)
Patient transported to CT by [place].

## 2015-12-15 NOTE — Discharge Instructions (Signed)
Warm compresses on/off.  Antibiotic as directed.  Follow-up with your doctor this week

## 2015-12-15 NOTE — ED Notes (Signed)
Pt reports left side of face and jaw began swelling about 1830 this evening.  Pt reports tenderness. Pt denies ear pain, dental pain, or neck pain.

## 2015-12-15 NOTE — ED Notes (Signed)
Pt returned from CT °

## 2015-12-15 NOTE — ED Triage Notes (Signed)
Pt c/o lump to left side of neck; pt states it is a little sore; pt denies any difficulty breathing

## 2015-12-18 NOTE — ED Provider Notes (Signed)
[facility] DEPT Provider Note   CSN: [id number] Arrival date & time: 12/15/15  1855     History   Chief Complaint Chief Complaint  Patient presents with  . Facial Swelling    HPI Kimberly Bowen is a 65 y.o. female.  HPI  Kimberly Bowen is a 65 y.o. female who presents to the Emergency Department complaining of sudden onset of left neck swelling.  She noticed the swelling shortly prior to ED arrival.  She describes a "full feeling" just below her left ear.   She denies pain, difficulty swallowing, fever, dental pain, and syncope.  She also denies previous symptoms.   Past Medical History:  Diagnosis Date  . Colon polyps    multiple hyperplastic polyps  . Eczema   . Hyperlipidemia    not on med    Patient Active Problem List   Diagnosis Date Noted  . Personal history of colonic polyps 01/22/2014  . FH: colon cancer 01/22/2014    Past Surgical History:  Procedure Laterality Date  . COLONOSCOPY  09/26/2006     ZDG:LOVFIE rectum, pan colonic diverticula/Multiple colonic polyps removed and/or destroyed, as described above (hyperplastic)  . COLONOSCOPY N/A 02/11/2014   Procedure: COLONOSCOPY;  Surgeon: Daneil Dolin, MD;  Location: [facility] SUITE;  Service: Endoscopy;  Laterality: N/A;  1:00   . VAGINAL HYSTERECTOMY      OB History    No data available       Home Medications    Prior to Admission medications   Medication Sig Start Date End Date Taking? Authorizing Provider  amoxicillin-clavulanate (AUGMENTIN) 875-125 MG tablet Take 1 tablet by mouth every 12 (twelve) hours. 12/15/15   Keyira Mondesir, PA-C  diphenhydrAMINE (BENADRYL) 25 MG tablet Take 25 mg by mouth at bedtime as needed for itching.     Historical Provider, MD  fexofenadine (ALLEGRA ALLERGY) 180 MG tablet Take 180 mg by mouth daily as needed for allergies.    Historical Provider, MD  peg 3350 powder (MOVIPREP) 100 G SOLR Take 1 kit (200 g total) by mouth as directed. 01/22/14   Daneil Dolin,  MD    Family History Family History  Problem Relation Age of Onset  . Colon cancer Other   . Leukemia Mother   . Lupus Sister   . Breast cancer Maternal Aunt     age 57  . Cancer Cousin     three first cousins died of cancer    Social History Social History  Substance Use Topics  . Smoking status: Current Every Day Smoker    Packs/day: 1.00    Years: 40.00    Types: Cigarettes  . Smokeless tobacco: Never Used     Comment: 1/2 pack daily  . Alcohol use No     Allergies   Review of patient's allergies indicates no known allergies.   Review of Systems Review of Systems  Constitutional: Negative for appetite change, chills and fever.  HENT: Negative for congestion, dental problem, facial swelling, sore throat, trouble swallowing and voice change.   Eyes: Negative for pain and visual disturbance.  Respiratory: Negative for shortness of breath.   Cardiovascular: Negative for chest pain.  Gastrointestinal: Negative for abdominal pain, nausea and vomiting.  Genitourinary: Negative for difficulty urinating and dysuria.  Musculoskeletal: Positive for arthralgias, joint swelling and neck pain (neck swelling). Negative for neck stiffness.  Skin: Negative for color change and wound.  Neurological: Negative for dizziness, syncope, facial asymmetry, weakness and headaches.  Hematological: Negative  for adenopathy.  All other systems reviewed and are negative.    Physical Exam Updated Vital Signs BP 132/79 (BP Location: Left Arm)   Pulse 74   Temp 98.3 F (36.8 C) (Oral)   Resp 17   Ht _0  (1.626 m)   Wt 96.2 kg   SpO2 99%   BMI 36.39 kg/m   Physical Exam  Constitutional: She is oriented to person, place, and time. She appears well-developed and well-nourished. No distress.  HENT:  Head: Normocephalic and atraumatic.  Right Ear: Tympanic membrane and ear canal normal.  Left Ear: Tympanic membrane and ear canal normal.  Mouth/Throat: Uvula is midline, oropharynx is  clear and moist and mucous membranes are normal. No trismus in the jaw. No dental abscesses, uvula swelling or dental caries. No tonsillar abscesses.  palpable mass to the left submandibular region.  No erythema.  No dental tenderenss  Neck: Normal range of motion. Neck supple.  Cardiovascular: Normal rate, regular rhythm and normal heart sounds.   No murmur heard. Pulmonary/Chest: Effort normal and breath sounds normal.  Musculoskeletal: Normal range of motion.  Lymphadenopathy:    She has no cervical adenopathy.  Neurological: She is alert and oriented to person, place, and time. She exhibits normal muscle tone. Coordination normal.  Skin: Skin is warm and dry.  Nursing note and vitals reviewed.    ED Treatments / Results  Labs (all labs ordered are listed, but only abnormal results are displayed) Labs Reviewed  I-STAT CHEM 8, ED - Abnormal; Notable for the following:       Result Value   Glucose, Bld 115 (*)    Hemoglobin 15.6 (*)    All other components within normal limits  I-STAT CREATININE, ED    EKG  EKG Interpretation None       Radiology Ct Soft Tissue Neck W Contrast  Result Date: 12/15/2015 CLINICAL DATA:  Acute onset LEFT neck swelling at 1830 hours tonight. Tenderness. EXAM: CT NECK WITH CONTRAST TECHNIQUE: Multidetector CT imaging of the neck was performed using the standard protocol following the bolus administration of intravenous contrast. CONTRAST:  31m ISOVUE-300 IOPAMIDOL (ISOVUE-300) INJECTION 61% COMPARISON:  None. FINDINGS: Pharynx and larynx: Normal. Salivary glands: Mild edema LEFT parotid gland, predominately involving the superficial lobe and tail with mild LEFT platysma thickening and LEFT neck subcutaneous fat stranding. No sialolith, ductal dilatation or mass. Normal appearance of the remaining major salivary glands. Thyroid: Normal. Lymph nodes: No lymphadenopathy by CT size criteria. Scattered small lymph nodes are likely reactive. Vascular: Mild  calcific atherosclerosis and intimal thickening of the LEFT greater than RIGHT internal carotid artery origins. Limited intracranial: Normal. Visualized orbits: Normal. Mastoids and visualized paranasal sinuses: Mild sphenoid ethmoidal sinusitis. Mastoid air cells are well aerated. Skeleton: Moderate to severe C5-6, C6-7 disc height loss and uncovertebral hypertrophy compatible with degenerative discs. Moderate to severe RIGHT C5-6 and RIGHT C6-7 neural foraminal narrowing. Upper chest: At least moderate centrilobular emphysema. Bilateral fibro nodular apical scarring. No superior mediastinal lymphadenopathy. IMPRESSION: Mild acute LEFT parotiditis. Electronically Signed   By: CElon AlasM.D.   On: 12/15/2015 21:28     Procedures Procedures (including critical care time)  Medications Ordered in ED Medications  iopamidol (ISOVUE-300) 61 % injection 75 mL (75 mLs Intravenous Contrast Given 12/15/15 2104)     Initial Impression / Assessment and Plan / ED Course  I have reviewed the triage vital signs and the nursing notes.  Pertinent labs & imaging results that were available during  my care of the patient were reviewed by me and considered in my medical decision making (see chart for details).  Clinical Course   Pt well appearing, left neck swelling w/o pain or other symptoms.  CT shows parotiditis.  Airway patent.  Pt stable for d/c, agrees to augmentin and ENT f/u  Final Clinical Impressions(s) / ED Diagnoses   Final diagnoses:  Parotiditis    New Prescriptions Discharge Medication List as of 12/15/2015  9:47 PM    START taking these medications   Details  amoxicillin-clavulanate (AUGMENTIN) 875-125 MG tablet Take 1 tablet by mouth every 12 (twelve) hours., Starting Tue 12/15/2015, Print         Tahir Blank [place], PA-C 12/18/15 2106    Isla Pence, MD 12/18/15 [phone]

## 2017-01-03 ENCOUNTER — Other Ambulatory Visit (HOSPITAL_COMMUNITY): Payer: Self-pay | Admitting: Internal Medicine

## 2017-01-03 DIAGNOSIS — Z1231 Encounter for screening mammogram for malignant neoplasm of breast: Secondary | ICD-10-CM

## 2017-01-04 ENCOUNTER — Ambulatory Visit (HOSPITAL_COMMUNITY)
Admission: RE | Admit: 2017-01-04 | Discharge: 2017-01-04 | Disposition: A | Discharge status: Home / Self Care | Payer: 59 | Source: Ambulatory Visit | Attending: Internal Medicine | Admitting: Internal Medicine

## 2017-01-04 DIAGNOSIS — Z1231 Encounter for screening mammogram for malignant neoplasm of breast: Secondary | ICD-10-CM | POA: Insufficient documentation

## 2017-01-11 ENCOUNTER — Ambulatory Visit (HOSPITAL_COMMUNITY): Payer: 59

## 2018-05-22 ENCOUNTER — Other Ambulatory Visit: Payer: Self-pay | Admitting: Internal Medicine

## 2018-05-22 DIAGNOSIS — R0602 Shortness of breath: Secondary | ICD-10-CM

## 2018-05-28 ENCOUNTER — Encounter (HOSPITAL_COMMUNITY): Payer: Self-pay

## 2018-05-28 ENCOUNTER — Ambulatory Visit (HOSPITAL_COMMUNITY)
Admission: RE | Admit: 2018-05-28 | Discharge: 2018-05-28 | Disposition: A | Discharge status: Home / Self Care | Payer: 59 | Source: Ambulatory Visit | Attending: Internal Medicine | Admitting: Internal Medicine

## 2018-05-28 DIAGNOSIS — R0602 Shortness of breath: Secondary | ICD-10-CM | POA: Insufficient documentation

## 2019-05-06 ENCOUNTER — Other Ambulatory Visit (HOSPITAL_COMMUNITY): Payer: Self-pay | Admitting: Internal Medicine

## 2019-05-06 DIAGNOSIS — Z1231 Encounter for screening mammogram for malignant neoplasm of breast: Secondary | ICD-10-CM

## 2019-05-09 ENCOUNTER — Ambulatory Visit (HOSPITAL_COMMUNITY)
Admission: RE | Admit: 2019-05-09 | Discharge: 2019-05-09 | Disposition: A | Discharge status: Home / Self Care | Payer: 59 | Source: Ambulatory Visit | Attending: Internal Medicine | Admitting: Internal Medicine

## 2019-05-09 ENCOUNTER — Other Ambulatory Visit: Payer: Self-pay

## 2019-05-09 DIAGNOSIS — Z1231 Encounter for screening mammogram for malignant neoplasm of breast: Secondary | ICD-10-CM | POA: Diagnosis present

## 2019-08-14 NOTE — H&P (Signed)
Surgical History & Physical  Patient Name: Kimberly Bowen DOB: Jan 28, 1951  Surgery: Cataract extraction with intraocular lens implant phacoemulsification; Left Eye  Surgeon: [name] MD Surgery Date:  08/26/2019 Pre-Op Date:  08/14/2019  HPI: A 46 Yr. old female patient is referred by Dr Hassell Done for cataract eval. 1. 1. The patient complains of nighttime light - car headlights, street lamps etc. glare causing poor vision, which began 6 months ago. Both eyes are affected. The episode is gradual. The condition's severity increased since last visit. Symptoms occur when the patient is driving and outside. The complaint is associated with glare. Pt states she does not prefer to drive at night. This is negatively affecting the patient's quality of life. HPI was performed by [name] .  Medical History: Optic Disc pit/coloboma OS Anxiety High Blood Pressure  Review of Systems Respiratory Shortness of Breath All recorded systems are negative except as noted above.  Social   Current every day smoker of Cigarettes 1 pack/day   Medication Albuterol, Lisinopril, Lorazepam, Clobetasol, Ketocanazole cream, Ibuprofen,   Sx/Procedures Cataract Surgery,  Hysterectomy(fibroids),   Drug Allergies   NKDA  History & Physical: Heent:  Cataract, left eye NECK: supple without bruits LUNGS: lungs clear to auscultation CV: regular rate and rhythm Abdomen: soft and non-tender  Impression & Plan: Assessment: 1.  COMBINED FORMS AGE RELATED CATARACT; Both Eyes (H25.813)  Plan: 1.  Cataract accounts for the patient's decreased vision. This visual impairment is not correctable with a tolerable change in glasses or contact lenses. Cataract surgery with an implantation of a new lens should significantly improve the visual and functional status of the patient. Discussed all risks, benefits, alternatives, and potential complications. Discussed the procedures and recovery. Patient desires to have  surgery. A-scan ordered and performed today for intra-ocular lens calculations. The surgery will be performed in order to improve vision for driving, reading, and for eye examinations. Recommend phacoemulsification with intra-ocular lens. Left Eye worse - first. Dilates well - shugarcaine by protocol.

## 2019-08-22 ENCOUNTER — Encounter (HOSPITAL_COMMUNITY): Admission: RE | Admit: 2019-08-22 | Payer: Medicare Other | Source: Ambulatory Visit

## 2019-08-22 ENCOUNTER — Other Ambulatory Visit (HOSPITAL_COMMUNITY): Payer: Medicare Other

## 2019-08-29 NOTE — H&P (Signed)
Surgical History & Physical  Patient Name: Kimberly Bowen DOB: 1950/09/20  Surgery: Cataract extraction with intraocular lens implant phacoemulsification; Left Eye  Surgeon: [name] MD Surgery Date:  09/09/2019 Pre-Op Date:  08/20/2019  HPI: A 55 Yr. old female patient is referred by Dr Hassell Done for cataract eval. 1. 1. The patient complains of nighttime light - car headlights, street lamps etc. glare causing poor vision, which began 6 months ago. Both eyes are affected. The episode is gradual. The condition's severity increased since last visit. Symptoms occur when the patient is driving and outside. The complaint is associated with glare. Pt states she does not prefer to drive at night. This is negatively affecting the patient's quality of life. HPI was performed by [name] .  Medical History: Optic Disc pit/coloboma OS Cataracts Anxiety High Blood Pressure  Review of Systems Respiratory Shortness of Breath All recorded systems are negative except as noted above.  Social   Current every day smoker of Cigarettes 1 pack/day   Medication Albuterol, Lisinopril, Lorazepam, Clobetasol, Ketocanazole cream, Ibuprofen,   Sx/Procedures Hysterectomy(fibroids),   Drug Allergies   NKDA  History & Physical: Heent:  Cataract, Left eye NECK: supple without bruits LUNGS: lungs clear to auscultation CV: regular rate and rhythm Abdomen: soft and non-tender  Impression & Plan: Assessment: 1.  COMBINED FORMS AGE RELATED CATARACT; Both Eyes (H25.813)  Plan: 1.  Cataract accounts for the patient's decreased vision. This visual impairment is not correctable with a tolerable change in glasses or contact lenses. Cataract surgery with an implantation of a new lens should significantly improve the visual and functional status of the patient. Discussed all risks, benefits, alternatives, and potential complications. Discussed the procedures and recovery. Patient desires to have surgery.  A-scan ordered and performed today for intra-ocular lens calculations. The surgery will be performed in order to improve vision for driving, reading, and for eye examinations. Recommend phacoemulsification with intra-ocular lens. Left Eye worse - first. Dilates well - shugarcaine by protocol.

## 2019-09-05 ENCOUNTER — Encounter (HOSPITAL_COMMUNITY): Payer: Self-pay

## 2019-09-06 ENCOUNTER — Other Ambulatory Visit: Payer: Self-pay

## 2019-09-06 ENCOUNTER — Encounter (HOSPITAL_COMMUNITY)
Admission: RE | Admit: 2019-09-06 | Discharge: 2019-09-06 | Disposition: A | Discharge status: Home / Self Care | Payer: Medicare Other | Source: Ambulatory Visit | Attending: Ophthalmology | Admitting: Ophthalmology

## 2019-09-06 ENCOUNTER — Other Ambulatory Visit (HOSPITAL_COMMUNITY)
Admission: RE | Admit: 2019-09-06 | Discharge: 2019-09-06 | Disposition: A | Discharge status: Home / Self Care | Payer: Medicare Other | Source: Ambulatory Visit | Attending: Ophthalmology | Admitting: Ophthalmology

## 2019-09-06 DIAGNOSIS — Z01812 Encounter for preprocedural laboratory examination: Secondary | ICD-10-CM | POA: Diagnosis present

## 2019-09-06 DIAGNOSIS — Z20822 Contact with and (suspected) exposure to covid-19: Secondary | ICD-10-CM | POA: Insufficient documentation

## 2019-09-06 HISTORY — DX: Essential (primary) hypertension: I10

## 2019-09-06 HISTORY — DX: Anxiety disorder, unspecified: F41.9

## 2019-09-06 HISTORY — DX: Chronic obstructive pulmonary disease, unspecified: J44.9

## 2019-09-06 HISTORY — DX: Other seasonal allergic rhinitis: J30.2

## 2019-09-06 MED ORDER — CYCLOPENTOLATE-PHENYLEPHRINE 0.2-1 % OP SOLN
OPHTHALMIC | Status: AC
  Filled 2019-09-06: qty 2

## 2019-09-06 MED ORDER — PHENYLEPHRINE HCL 2.5 % OP SOLN
OPHTHALMIC | Status: AC
  Filled 2019-09-06: qty 15

## 2019-09-06 MED ORDER — LIDOCAINE HCL (PF) 1 % IJ SOLN
INTRAMUSCULAR | Status: AC
  Filled 2019-09-06: qty 2

## 2019-09-06 MED ORDER — LIDOCAINE HCL 3.5 % OP GEL
OPHTHALMIC | Status: AC
  Filled 2019-09-06: qty 1

## 2019-09-06 MED ORDER — NEOMYCIN-POLYMYXIN-DEXAMETH 3.5-10000-0.1 OP SUSP
OPHTHALMIC | Status: AC
  Filled 2019-09-06: qty 5

## 2019-09-06 MED ORDER — TETRACAINE HCL 0.5 % OP SOLN
OPHTHALMIC | Status: AC
  Filled 2019-09-06: qty 4

## 2019-09-07 LAB — SARS CORONAVIRUS 2 (TAT 6-24 HRS): SARS Coronavirus 2: NEGATIVE

## 2019-09-09 ENCOUNTER — Ambulatory Visit (HOSPITAL_COMMUNITY): Payer: Medicare Other | Admitting: Anesthesiology

## 2019-09-09 ENCOUNTER — Encounter (HOSPITAL_COMMUNITY): Payer: Self-pay | Admitting: Ophthalmology

## 2019-09-09 ENCOUNTER — Ambulatory Visit (HOSPITAL_COMMUNITY)
Admission: RE | Admit: 2019-09-09 | Discharge: 2019-09-09 | Disposition: A | Discharge status: Home / Self Care | Payer: Medicare Other | Attending: Ophthalmology | Admitting: Ophthalmology

## 2019-09-09 ENCOUNTER — Encounter (HOSPITAL_COMMUNITY)
Admission: RE | Disposition: A | Discharge status: Home / Self Care | Payer: Self-pay | Source: Home / Self Care | Attending: Ophthalmology

## 2019-09-09 DIAGNOSIS — I10 Essential (primary) hypertension: Secondary | ICD-10-CM | POA: Insufficient documentation

## 2019-09-09 DIAGNOSIS — Z791 Long term (current) use of non-steroidal anti-inflammatories (NSAID): Secondary | ICD-10-CM | POA: Insufficient documentation

## 2019-09-09 DIAGNOSIS — Z79899 Other long term (current) drug therapy: Secondary | ICD-10-CM | POA: Insufficient documentation

## 2019-09-09 DIAGNOSIS — F17210 Nicotine dependence, cigarettes, uncomplicated: Secondary | ICD-10-CM | POA: Diagnosis not present

## 2019-09-09 DIAGNOSIS — H25812 Combined forms of age-related cataract, left eye: Secondary | ICD-10-CM | POA: Diagnosis present

## 2019-09-09 DIAGNOSIS — F419 Anxiety disorder, unspecified: Secondary | ICD-10-CM | POA: Diagnosis not present

## 2019-09-09 HISTORY — PX: CATARACT EXTRACTION W/PHACO: SHX586

## 2019-09-09 SURGERY — PHACOEMULSIFICATION, CATARACT, WITH IOL INSERTION
Anesthesia: Monitor Anesthesia Care | Site: Eye | Laterality: Left

## 2019-09-09 MED ORDER — BSS IO SOLN
INTRAOCULAR | Status: DC | PRN
  Administered 2019-09-09: 15 mL via INTRAOCULAR

## 2019-09-09 MED ORDER — SODIUM HYALURONATE 23 MG/ML IO SOLN
INTRAOCULAR | Status: DC | PRN
  Administered 2019-09-09: 0.6 mL via INTRAOCULAR

## 2019-09-09 MED ORDER — POVIDONE-IODINE 5 % OP SOLN
OPHTHALMIC | Status: DC | PRN
  Administered 2019-09-09: 1 via OPHTHALMIC

## 2019-09-09 MED ORDER — EPINEPHRINE PF 1 MG/ML IJ SOLN
INTRAOCULAR | Status: DC | PRN
  Administered 2019-09-09: 500 mL

## 2019-09-09 MED ORDER — CYCLOPENTOLATE-PHENYLEPHRINE 0.2-1 % OP SOLN
1.0000 [drp] | OPHTHALMIC | Status: AC | PRN
  Administered 2019-09-09 (×3): 1 [drp] via OPHTHALMIC

## 2019-09-09 MED ORDER — PHENYLEPHRINE HCL 2.5 % OP SOLN
1.0000 [drp] | OPHTHALMIC | Status: AC | PRN
  Administered 2019-09-09 (×3): 1 [drp] via OPHTHALMIC

## 2019-09-09 MED ORDER — LIDOCAINE HCL (PF) 1 % IJ SOLN
INTRAOCULAR | Status: DC | PRN
  Administered 2019-09-09: 1 mL via OPHTHALMIC

## 2019-09-09 MED ORDER — NEOMYCIN-POLYMYXIN-DEXAMETH 3.5-10000-0.1 OP SUSP
OPHTHALMIC | Status: DC | PRN
  Administered 2019-09-09: 1 [drp] via OPHTHALMIC

## 2019-09-09 MED ORDER — PROVISC 10 MG/ML IO SOLN
INTRAOCULAR | Status: DC | PRN
  Administered 2019-09-09: 0.85 mL via INTRAOCULAR

## 2019-09-09 MED ORDER — LIDOCAINE HCL 3.5 % OP GEL
1.0000 "application " | Freq: Once | OPHTHALMIC | Status: AC
  Administered 2019-09-09: 1 via OPHTHALMIC

## 2019-09-09 MED ORDER — TETRACAINE HCL 0.5 % OP SOLN
1.0000 [drp] | OPHTHALMIC | Status: AC | PRN
  Administered 2019-09-09 (×3): 1 [drp] via OPHTHALMIC

## 2019-09-09 SURGICAL SUPPLY — 19 items
CLOTH BEACON ORANGE TIMEOUT ST (SAFETY) ×2 IMPLANT
DEVICE MILOOP (MISCELLANEOUS) IMPLANT
EYE SHIELD UNIVERSAL CLEAR (GAUZE/BANDAGES/DRESSINGS) ×2 IMPLANT
GLOVE BIOGEL PI IND STRL 6.5 (GLOVE) IMPLANT
GLOVE BIOGEL PI IND STRL 7.0 (GLOVE) IMPLANT
GLOVE BIOGEL PI INDICATOR 6.5 (GLOVE)
GLOVE BIOGEL PI INDICATOR 7.0 (GLOVE) ×4
LENS ALC ACRYL/TECN (Ophthalmic Related) ×2 IMPLANT
MILOOP DEVICE (MISCELLANEOUS)
NDL HYPO 18GX1.5 BLUNT FILL (NEEDLE) IMPLANT
NEEDLE HYPO 18GX1.5 BLUNT FILL (NEEDLE) ×3 IMPLANT
PAD ARMBOARD 7.5X6 YLW CONV (MISCELLANEOUS) ×2 IMPLANT
RING MALYGIN (MISCELLANEOUS) IMPLANT
RING MALYGIN 7.0 (MISCELLANEOUS) IMPLANT
SYR TB 1ML LL NO SAFETY (SYRINGE) ×2 IMPLANT
TAPE SURG TRANSPORE 1 IN (GAUZE/BANDAGES/DRESSINGS) IMPLANT
TAPE SURGICAL TRANSPORE 1 IN (GAUZE/BANDAGES/DRESSINGS) ×3
VISCOELASTIC ADDITIONAL (OPHTHALMIC RELATED) ×2 IMPLANT
WATER STERILE IRR 250ML POUR (IV SOLUTION) ×2 IMPLANT

## 2019-09-09 NOTE — Interval H&P Note (Signed)
History and Physical Interval Note: The H and P was reviewed and updated. The patient was examined.  No changes were found after exam.  The surgical eye was marked.  09/09/2019 12:18 PM  Kimberly Bowen  has presented today for surgery, with the diagnosis of Nuclear sclerotic cataract - Left eye.  The various methods of treatment have been discussed with the patient and family. After consideration of risks, benefits and other options for treatment, the patient has consented to  Procedure(s) with comments: CATARACT EXTRACTION PHACO AND INTRAOCULAR LENS PLACEMENT (IOC) (Left) - t as a surgical intervention.  The patient's history has been reviewed, patient examined, no change in status, stable for surgery.  I have reviewed the patient's chart and labs.  Questions were answered to the patient's satisfaction.     [name]

## 2019-09-09 NOTE — Discharge Instructions (Signed)
Please discharge patient when stable, will follow up today with Dr. Romir Klimowicz at the [facility] [place] office immediately following discharge.  Leave shield in place until visit.  All paperwork with discharge instructions will be given at the office.  [facility] Address:  [address]  [place], [place] [address]  

## 2019-09-09 NOTE — Transfer of Care (Signed)
Immediate Anesthesia Transfer of Care Note  Patient: Kimberly Bowen  Procedure(s) Performed: CATARACT EXTRACTION PHACO AND INTRAOCULAR LENS PLACEMENT (IOC) CDE:  8.15 (Left Eye)  Patient Location: Short Stay  Anesthesia Type:MAC  Level of Consciousness: awake  Airway & Oxygen Therapy: Patient Spontanous Breathing  Post-op Assessment: Report given to RN  Post vital signs: Reviewed  Last Vitals:  Vitals Value Taken Time  BP    Temp    Pulse    Resp    SpO2      Last Pain:  Vitals:   09/09/19 1210  PainSc: 0-No pain         Complications: No apparent anesthesia complications

## 2019-09-09 NOTE — Anesthesia Preprocedure Evaluation (Signed)
Anesthesia Evaluation  Patient identified by MRN, date of birth, ID band Patient awake    Reviewed: Allergy & Precautions, NPO status , Patient's Chart, lab work & pertinent test results  Airway Mallampati: II  TM Distance: >3 FB Neck ROM: Full    Dental  (+) Teeth Intact, Dental Advisory Given, Caps   Pulmonary COPD, Current Smoker and Patient abstained from smoking.,    breath sounds clear to auscultation       Cardiovascular Exercise Tolerance: Good hypertension, Pt. on medications Normal cardiovascular exam Rhythm:Regular Rate:Normal     Neuro/Psych Anxiety    GI/Hepatic negative GI ROS, Neg liver ROS,   Endo/Other  negative endocrine ROS  Renal/GU negative Renal ROS     Musculoskeletal   Abdominal   Peds  Hematology negative hematology ROS (+)   Anesthesia Other Findings   Reproductive/Obstetrics                             Anesthesia Physical Anesthesia Plan  ASA: II  Anesthesia Plan: MAC   Post-op Pain Management:    Induction:   PONV Risk Score and Plan:   Airway Management Planned: Nasal Cannula and Natural Airway  Additional Equipment:   Intra-op Plan:   Post-operative Plan:   Informed Consent: I have reviewed the patients History and Physical, chart, labs and discussed the procedure including the risks, benefits and alternatives for the proposed anesthesia with the patient or authorized representative who has indicated his/her understanding and acceptance.     Dental advisory given  Plan Discussed with: CRNA and Surgeon  Anesthesia Plan Comments:         Anesthesia Quick Evaluation

## 2019-09-09 NOTE — Op Note (Signed)
Date of procedure: 09/09/19  Pre-operative diagnosis: Visually significant age-related combined cataract, Left Eye (H25.812)  Post-operative diagnosis: Visually significant age-related combined cataract, Left Eye (H25.812)  Procedure: Removal of cataract via phacoemulsification and insertion of intra-ocular lens Wynetta Emery and [place]  +25.5D into the capsular bag of the Left Eye  Attending surgeon: Gerda Diss. Maron Stanzione, MD, MA  Anesthesia: MAC, Topical [place]  Complications: None  Estimated Blood Loss: <30m (minimal)  Specimens: None  Implants: As above  Indications:  Visually significant age-related cataract, Left Eye  Procedure:  The patient was seen and identified in the pre-operative area. The operative eye was identified and dilated.  The operative eye was marked.  Topical anesthesia was administered to the operative eye.     The patient was then to the operative suite and placed in the supine position.  A timeout was performed confirming the patient, procedure to be performed, and all other relevant information.   The patient's face was prepped and draped in the usual fashion for intra-ocular surgery.  A lid speculum was placed into the operative eye and the surgical microscope moved into place and focused.  An inferotemporal paracentesis was created using a 20 gauge paracentesis blade.  Shugarcaine was injected into the anterior chamber.  Viscoelastic was injected into the anterior chamber.  A temporal clear-corneal main wound incision was created using a 2.447mmicrokeratome.  A continuous curvilinear capsulorrhexis was initiated using an irrigating cystitome and completed using capsulorrhexis forceps.  Hydrodissection and hydrodeliniation were performed.  Viscoelastic was injected into the anterior chamber.  A phacoemulsification handpiece and a chopper as a second instrument were used to remove the nucleus and epinucleus. The irrigation/aspiration handpiece was used to remove  any remaining cortical material.   The capsular bag was reinflated with viscoelastic, checked, and found to be intact.  The intraocular lens was inserted into the capsular bag.  The irrigation/aspiration handpiece was used to remove any remaining viscoelastic.  The clear corneal wound and paracentesis wounds were then hydrated and checked with Weck-Cels to be watertight.  The lid-speculum was removed.  The drape was removed.  The patient's face was cleaned with a wet and dry 4x4.   Maxitrol was instilled in the eye before a clear shield was taped over the eye. The patient was taken to the post-operative care unit in good condition, having tolerated the procedure well.  Post-Op Instructions: [facility] and will receive all other orders and instructions.

## 2019-09-09 NOTE — Anesthesia Postprocedure Evaluation (Signed)
Anesthesia Post Note  Patient: Kimberly Bowen  Procedure(s) Performed: CATARACT EXTRACTION PHACO AND INTRAOCULAR LENS PLACEMENT (IOC) CDE:  8.15 (Left Eye)  Patient location during evaluation: PACU Anesthesia Type: MAC Level of consciousness: awake and alert Pain management: pain level controlled Vital Signs Assessment: post-procedure vital signs reviewed and stable Respiratory status: spontaneous breathing Cardiovascular status: stable Postop Assessment: no apparent nausea or vomiting Anesthetic complications: no     Last Vitals:  Vitals:   09/09/19 1210  BP: (!) 142/78  Pulse: 82  Resp: 16  Temp: 37.1 C  SpO2: 94%    Last Pain:  Vitals:   09/09/19 1210  PainSc: 0-No pain                 Danilynn Jemison

## 2019-09-17 NOTE — H&P (Signed)
Surgical History & Physical  Patient Name: Kimberly Bowen DOB: 07-Mar-1951  Surgery: Cataract extraction with intraocular lens implant phacoemulsification; Right Eye  Surgeon: [name] MD Surgery Date:  09/23/2019 Pre-Op Date:  09/16/2019  HPI: A 85 Yr. old female patient presents for a 1wk PO OS and pre op OD. PT states since sx she has noticed a "crescent shape" nasally OS. Pt states she does not see it all the time- only when she looks to the side. PT states otherwise her vision in her left eye seems to be great. Pt has been compliant with all PO gtts since last visit. No eye pain. PT is ready to have her right eye done. She states her right eye is now her "bad" eye. She feels vision is compromised at both reading and distance. She feels her glasses aren't helping as much as they were with her right eye. This is negatively affecting the patient's quality of life.No flashes or floaters. HPI was performed by [name] .  Medical History: Cataracts Optic Disc pit/coloboma OS Anxiety High Blood Pressure  Review of Systems Respiratory Shortness of Breath All recorded systems are negative except as noted above.  Social   Current every day smoker of Cigarettes 1 pack/day   Medication Prednisolone-gatiflox-bromfenac,  Albuterol, Lisinopril, Lorazepam, Clobetasol, Ketocanazole cream, Ibuprofen,   Sx/Procedures Cataract Surgery, Phaco c IOL OS,  Hysterectomy(fibroids),   Drug Allergies   NKDA  History & Physical: Heent:  Cataract, Right eye NECK: supple without bruits LUNGS: lungs clear to auscultation CV: regular rate and rhythm Abdomen: soft and non-tender  Impression & Plan: Assessment: 1.  CATARACT EXTRACTION STATUS; Left Eye (Z98.42) 2.  COMBINED FORMS AGE RELATED CATARACT; Right Eye (H25.811) 3.  VITREOUS DETACHMENT PVD; Left Eye (H43.812) 4.  Myopia ; Left Eye (H52.12)  Plan: 1.  1 week after cataract surgery. Doing well with improved vision and normal eye  pressure. Call with any problems or concerns. Continue Gati-Brom-Pred 2x/day for 3 more weeks. 2.  Dilates well - shugarcaine by protocol. Cataract accounts for the patient's decreased vision. This visual impairment is not correctable with a tolerable change in glasses or contact lenses. Cataract surgery with an implantation of a new lens should significantly improve the visual and functional status of the patient. Discussed all risks, benefits, alternatives, and potential complications. Discussed the procedures and recovery. Patient desires to have surgery. A-scan ordered and performed today for intra-ocular lens calculations. The surgery will be performed in order to improve vision for driving, reading, and for eye examinations. Recommend phacoemulsification with intra-ocular lens. Right Eye. Surgery required to correct imbalance of vision. 3.  New Symptomatic. RD precautions given. Patient to call with increase in flashing lights/floaters/dark curtain.

## 2019-09-20 ENCOUNTER — Encounter (HOSPITAL_COMMUNITY)
Admission: RE | Admit: 2019-09-20 | Discharge: 2019-09-20 | Disposition: A | Discharge status: Home / Self Care | Payer: Medicare Other | Source: Ambulatory Visit | Attending: Ophthalmology | Admitting: Ophthalmology

## 2019-09-20 ENCOUNTER — Other Ambulatory Visit (HOSPITAL_COMMUNITY)
Admission: RE | Admit: 2019-09-20 | Discharge: 2019-09-20 | Disposition: A | Discharge status: Home / Self Care | Payer: Medicare Other | Source: Ambulatory Visit | Attending: Ophthalmology | Admitting: Ophthalmology

## 2019-09-20 ENCOUNTER — Other Ambulatory Visit: Payer: Self-pay

## 2019-09-20 DIAGNOSIS — Z01812 Encounter for preprocedural laboratory examination: Secondary | ICD-10-CM | POA: Diagnosis present

## 2019-09-20 DIAGNOSIS — Z20822 Contact with and (suspected) exposure to covid-19: Secondary | ICD-10-CM | POA: Insufficient documentation

## 2019-09-20 LAB — SARS CORONAVIRUS 2 (TAT 6-24 HRS): SARS Coronavirus 2: NEGATIVE

## 2019-09-23 ENCOUNTER — Encounter (HOSPITAL_COMMUNITY)
Admission: RE | Disposition: A | Discharge status: Home / Self Care | Payer: Self-pay | Source: Home / Self Care | Attending: Ophthalmology

## 2019-09-23 ENCOUNTER — Ambulatory Visit (HOSPITAL_COMMUNITY): Payer: Medicare Other | Admitting: Anesthesiology

## 2019-09-23 ENCOUNTER — Ambulatory Visit (HOSPITAL_COMMUNITY)
Admission: RE | Admit: 2019-09-23 | Discharge: 2019-09-23 | Disposition: A | Discharge status: Home / Self Care | Payer: Medicare Other | Attending: Ophthalmology | Admitting: Ophthalmology

## 2019-09-23 DIAGNOSIS — F419 Anxiety disorder, unspecified: Secondary | ICD-10-CM | POA: Diagnosis not present

## 2019-09-23 DIAGNOSIS — Z79899 Other long term (current) drug therapy: Secondary | ICD-10-CM | POA: Insufficient documentation

## 2019-09-23 DIAGNOSIS — J449 Chronic obstructive pulmonary disease, unspecified: Secondary | ICD-10-CM | POA: Insufficient documentation

## 2019-09-23 DIAGNOSIS — I10 Essential (primary) hypertension: Secondary | ICD-10-CM | POA: Insufficient documentation

## 2019-09-23 DIAGNOSIS — Z791 Long term (current) use of non-steroidal anti-inflammatories (NSAID): Secondary | ICD-10-CM | POA: Insufficient documentation

## 2019-09-23 DIAGNOSIS — H5212 Myopia, left eye: Secondary | ICD-10-CM | POA: Insufficient documentation

## 2019-09-23 DIAGNOSIS — H25811 Combined forms of age-related cataract, right eye: Secondary | ICD-10-CM | POA: Insufficient documentation

## 2019-09-23 DIAGNOSIS — F17210 Nicotine dependence, cigarettes, uncomplicated: Secondary | ICD-10-CM | POA: Insufficient documentation

## 2019-09-23 HISTORY — PX: CATARACT EXTRACTION W/PHACO: SHX586

## 2019-09-23 SURGERY — PHACOEMULSIFICATION, CATARACT, WITH IOL INSERTION
Anesthesia: Monitor Anesthesia Care | Site: Eye | Laterality: Right

## 2019-09-23 MED ORDER — LIDOCAINE HCL 3.5 % OP GEL
1.0000 "application " | Freq: Once | OPHTHALMIC | Status: AC
  Administered 2019-09-23: 1 via OPHTHALMIC

## 2019-09-23 MED ORDER — CYCLOPENTOLATE-PHENYLEPHRINE 0.2-1 % OP SOLN
1.0000 [drp] | OPHTHALMIC | Status: AC | PRN
  Administered 2019-09-23 (×3): 1 [drp] via OPHTHALMIC

## 2019-09-23 MED ORDER — EPINEPHRINE PF 1 MG/ML IJ SOLN
INTRAOCULAR | Status: DC | PRN
  Administered 2019-09-23: 500 mL

## 2019-09-23 MED ORDER — LIDOCAINE HCL (PF) 1 % IJ SOLN
INTRAOCULAR | Status: DC | PRN
  Administered 2019-09-23: 1 mL via OPHTHALMIC

## 2019-09-23 MED ORDER — NEOMYCIN-POLYMYXIN-DEXAMETH 3.5-10000-0.1 OP SUSP
OPHTHALMIC | Status: DC | PRN
  Administered 2019-09-23: 1 [drp] via OPHTHALMIC

## 2019-09-23 MED ORDER — POVIDONE-IODINE 5 % OP SOLN
OPHTHALMIC | Status: DC | PRN
  Administered 2019-09-23: 1 via OPHTHALMIC

## 2019-09-23 MED ORDER — TETRACAINE HCL 0.5 % OP SOLN
1.0000 [drp] | OPHTHALMIC | Status: AC | PRN
  Administered 2019-09-23 (×3): 1 [drp] via OPHTHALMIC

## 2019-09-23 MED ORDER — BSS IO SOLN
INTRAOCULAR | Status: DC | PRN
  Administered 2019-09-23: 15 mL via INTRAOCULAR

## 2019-09-23 MED ORDER — EPINEPHRINE PF 1 MG/ML IJ SOLN
INTRAMUSCULAR | Status: AC
  Filled 2019-09-23: qty 2

## 2019-09-23 MED ORDER — ORAL CARE MOUTH RINSE: 15.0000 mL | Freq: Once | OROMUCOSAL | Status: DC

## 2019-09-23 MED ORDER — PHENYLEPHRINE HCL 2.5 % OP SOLN
1.0000 [drp] | OPHTHALMIC | Status: AC | PRN
  Administered 2019-09-23 (×3): 1 [drp] via OPHTHALMIC

## 2019-09-23 MED ORDER — CHLORHEXIDINE GLUCONATE 0.12 % MT SOLN: 15.0000 mL | Freq: Once | OROMUCOSAL | Status: DC

## 2019-09-23 MED ORDER — PROVISC 10 MG/ML IO SOLN
INTRAOCULAR | Status: DC | PRN
  Administered 2019-09-23: 0.85 mL via INTRAOCULAR

## 2019-09-23 MED ORDER — SODIUM HYALURONATE 23 MG/ML IO SOLN
INTRAOCULAR | Status: DC | PRN
  Administered 2019-09-23: 0.6 mL via INTRAOCULAR

## 2019-09-23 SURGICAL SUPPLY — 13 items
CLOTH BEACON ORANGE TIMEOUT ST (SAFETY) ×2 IMPLANT
EYE SHIELD UNIVERSAL CLEAR (GAUZE/BANDAGES/DRESSINGS) ×2 IMPLANT
GLOVE BIOGEL PI IND STRL 7.0 (GLOVE) IMPLANT
GLOVE BIOGEL PI INDICATOR 7.0 (GLOVE) ×4
LENS ALC ACRYL/TECN (Ophthalmic Related) ×2 IMPLANT
NDL HYPO 18GX1.5 BLUNT FILL (NEEDLE) IMPLANT
NEEDLE HYPO 18GX1.5 BLUNT FILL (NEEDLE) ×3 IMPLANT
PAD ARMBOARD 7.5X6 YLW CONV (MISCELLANEOUS) ×2 IMPLANT
SYR TB 1ML LL NO SAFETY (SYRINGE) ×2 IMPLANT
TAPE SURG TRANSPORE 1 IN (GAUZE/BANDAGES/DRESSINGS) IMPLANT
TAPE SURGICAL TRANSPORE 1 IN (GAUZE/BANDAGES/DRESSINGS) ×3
VISCOELASTIC ADDITIONAL (OPHTHALMIC RELATED) ×2 IMPLANT
WATER STERILE IRR 250ML POUR (IV SOLUTION) ×2 IMPLANT

## 2019-09-23 NOTE — Op Note (Signed)
Date of procedure: 09/23/19  Pre-operative diagnosis:  Visually significant combined form age-related cataract, Right Eye (H25.811)  Post-operative diagnosis:  Visually significant combined form age-related cataract, Right Eye (H25.811)  Procedure: Removal of cataract via phacoemulsification and insertion of intra-ocular lens Wynetta Emery and [employer] DCB00  +25.0D into the capsular bag of the Right Eye  Attending surgeon: Gerda Diss. Robertha Staples, MD, MA  Anesthesia: MAC, Topical Akten  Complications: None  Estimated Blood Loss: <66m (minimal)  Specimens: None  Implants: As above  Indications:  Visually significant age-related cataract, Right Eye  Procedure:  The patient was seen and identified in the pre-operative area. The operative eye was identified and dilated.  The operative eye was marked.  Topical anesthesia was administered to the operative eye.     The patient was then to the operative suite and placed in the supine position.  A timeout was performed confirming the patient, procedure to be performed, and all other relevant information.   The patient's face was prepped and draped in the usual fashion for intra-ocular surgery.  A lid speculum was placed into the operative eye and the surgical microscope moved into place and focused.  A superotemporal paracentesis was created using a 20 gauge paracentesis blade.  Shugarcaine was injected into the anterior chamber.  Viscoelastic was injected into the anterior chamber.  A temporal clear-corneal main wound incision was created using a 2.475mmicrokeratome.  A continuous curvilinear capsulorrhexis was initiated using an irrigating cystitome and completed using capsulorrhexis forceps.  Hydrodissection and hydrodeliniation were performed.  Viscoelastic was injected into the anterior chamber.  A phacoemulsification handpiece and a chopper as a second instrument were used to remove the nucleus and epinucleus. The irrigation/aspiration handpiece was  used to remove any remaining cortical material.   The capsular bag was reinflated with viscoelastic, checked, and found to be intact.  The intraocular lens was inserted into the capsular bag.  The irrigation/aspiration handpiece was used to remove any remaining viscoelastic.  The clear corneal wound and paracentesis wounds were then hydrated and checked with Weck-Cels to be watertight.  The lid-speculum was removed.  The drape was removed.  The patient's face was cleaned with a wet and dry 4x4.   Maxitrol was instilled in the eye before a clear shield was taped over the eye. The patient was taken to the post-operative care unit in good condition, having tolerated the procedure well.  Post-Op Instructions: [facility] and will receive all other orders and instructions.

## 2019-09-23 NOTE — Interval H&P Note (Signed)
History and Physical Interval Note: The H and P was reviewed and updated. The patient was examined.  No changes were found after exam.  The surgical eye was marked.  09/23/2019 9:00 AM  Kimberly Bowen  has presented today for surgery, with the diagnosis of Nuclear sclerotic cataract - Right eye.  The various methods of treatment have been discussed with the patient and family. After consideration of risks, benefits and other options for treatment, the patient has consented to  Procedure(s) with comments: CATARACT EXTRACTION PHACO AND INTRAOCULAR LENS PLACEMENT (IOC) (Right) - right, pt knows 8:30 case per office as a surgical intervention.  The patient's history has been reviewed, patient examined, no change in status, stable for surgery.  I have reviewed the patient's chart and labs.  Questions were answered to the patient's satisfaction.     [name]

## 2019-09-23 NOTE — Anesthesia Preprocedure Evaluation (Signed)
Anesthesia Evaluation  Patient identified by MRN, date of birth, ID band Patient awake    Reviewed: Allergy & Precautions, NPO status , Patient's Chart, lab work & pertinent test results  Airway Mallampati: II  TM Distance: >3 FB Neck ROM: Full    Dental  (+) Teeth Intact, Dental Advisory Given, Caps   Pulmonary COPD, Current Smoker and Patient abstained from smoking.,    breath sounds clear to auscultation       Cardiovascular Exercise Tolerance: Good hypertension, Pt. on medications Normal cardiovascular exam Rhythm:Regular Rate:Normal     Neuro/Psych Anxiety    GI/Hepatic negative GI ROS, Neg liver ROS,   Endo/Other  negative endocrine ROS  Renal/GU negative Renal ROS     Musculoskeletal   Abdominal   Peds  Hematology negative hematology ROS (+)   Anesthesia Other Findings   Reproductive/Obstetrics                             Anesthesia Physical Anesthesia Plan  ASA: II  Anesthesia Plan: MAC   Post-op Pain Management:    Induction:   PONV Risk Score and Plan:   Airway Management Planned: Nasal Cannula and Natural Airway  Additional Equipment:   Intra-op Plan:   Post-operative Plan:   Informed Consent: I have reviewed the patients History and Physical, chart, labs and discussed the procedure including the risks, benefits and alternatives for the proposed anesthesia with the patient or authorized representative who has indicated his/her understanding and acceptance.     Dental advisory given  Plan Discussed with: CRNA and Surgeon  Anesthesia Plan Comments:         Anesthesia Quick Evaluation  

## 2019-09-23 NOTE — Anesthesia Postprocedure Evaluation (Signed)
Anesthesia Post Note  Patient: Kimberly Bowen  Procedure(s) Performed: CATARACT EXTRACTION PHACO AND INTRAOCULAR LENS PLACEMENT RIGHT EYE (Right Eye)  Patient location during evaluation: Phase II Anesthesia Type: MAC Level of consciousness: awake, oriented, awake and alert and patient cooperative Pain management: satisfactory to patient Vital Signs Assessment: post-procedure vital signs reviewed and stable Respiratory status: spontaneous breathing, respiratory function stable and nonlabored ventilation Cardiovascular status: stable Postop Assessment: no apparent nausea or vomiting Anesthetic complications: no     Last Vitals:  Vitals:   09/23/19 0814  BP: 115/71  Pulse: 92  Resp: 14  Temp: 36.7 C  SpO2: 94%    Last Pain:  Vitals:   09/23/19 0814  TempSrc: Oral  PainSc: 0-No pain                 Klarisa Barman

## 2019-09-23 NOTE — Transfer of Care (Signed)
Immediate Anesthesia Transfer of Care Note  Patient: Kimberly Bowen  Procedure(s) Performed: CATARACT EXTRACTION PHACO AND INTRAOCULAR LENS PLACEMENT RIGHT EYE (Right Eye)  Patient Location: PACU  Anesthesia Type:MAC  Level of Consciousness: awake, alert , oriented and patient cooperative  Airway & Oxygen Therapy: Patient Spontanous Breathing  Post-op Assessment: Report given to RN, Post -op Vital signs reviewed and stable and Patient moving all extremities X 4  Post vital signs: Reviewed and stable  Last Vitals:  Vitals Value Taken Time  BP    Temp    Pulse    Resp    SpO2      Last Pain:  Vitals:   09/23/19 0814  TempSrc: Oral  PainSc: 0-No pain         Complications: No apparent anesthesia complications

## 2019-09-23 NOTE — Discharge Instructions (Signed)
Please discharge patient when stable, will follow up today with Dr. Ceriah Kohler at the [facility] [place] office immediately following discharge.  Leave shield in place until visit.  All paperwork with discharge instructions will be given at the office.  [facility] [place] Address:  [address]  [place], [place] [address]  

## 2020-06-26 ENCOUNTER — Other Ambulatory Visit (HOSPITAL_COMMUNITY): Payer: Self-pay | Admitting: Internal Medicine

## 2020-06-26 DIAGNOSIS — Z1231 Encounter for screening mammogram for malignant neoplasm of breast: Secondary | ICD-10-CM

## 2020-07-06 ENCOUNTER — Other Ambulatory Visit: Payer: Self-pay

## 2020-07-06 ENCOUNTER — Ambulatory Visit (HOSPITAL_COMMUNITY)
Admission: RE | Admit: 2020-07-06 | Discharge: 2020-07-06 | Disposition: A | Discharge status: Home / Self Care | Payer: Medicare Other | Source: Ambulatory Visit | Attending: Internal Medicine | Admitting: Internal Medicine

## 2020-07-06 DIAGNOSIS — Z1231 Encounter for screening mammogram for malignant neoplasm of breast: Secondary | ICD-10-CM | POA: Insufficient documentation

## 2021-01-11 ENCOUNTER — Encounter: Payer: Self-pay | Admitting: *Deleted

## 2021-02-03 ENCOUNTER — Encounter: Payer: Self-pay | Admitting: Internal Medicine

## 2021-03-16 ENCOUNTER — Encounter: Payer: Self-pay | Admitting: *Deleted

## 2021-03-16 ENCOUNTER — Other Ambulatory Visit: Payer: Self-pay

## 2021-03-16 ENCOUNTER — Ambulatory Visit (INDEPENDENT_AMBULATORY_CARE_PROVIDER_SITE_OTHER): Payer: Self-pay | Admitting: *Deleted

## 2021-03-16 VITALS — Ht 63.0 in | Wt 224.2 lb

## 2021-03-16 DIAGNOSIS — Z1211 Encounter for screening for malignant neoplasm of colon: Secondary | ICD-10-CM

## 2021-03-16 MED ORDER — PEG 3350-KCL-NA BICARB-NACL 420 G PO SOLR: 4000.0000 mL | Freq: Once | ORAL | 0 refills | Status: AC

## 2021-03-16 NOTE — Progress Notes (Signed)
Gastroenterology Pre-Procedure Review  Request Date: 03/16/2021 Requesting Physician: 7 year recall, Last TCS 02/11/2014 done by Dr. Gala Romney, hyperplastic polyps, family hx of colon cancer (grandmother)  PATIENT REVIEW QUESTIONS: The patient responded to the following health history questions as indicated:    1. Diabetes Melitis: no 2. Joint replacements in the past 12 months: no 3. Major health problems in the past 3 months: no 4. Has an artificial valve or MVP: no 5. Has a defibrillator: no 6. Has been advised in past to take antibiotics in advance of a procedure like teeth cleaning: no 7. Family history of colon cancer: yes, grandmother: age unknown 1. Alcohol Use: no 9. Illicit drug Use: no 10. History of sleep apnea: no 11. History of coronary artery or other vascular stents placed within the last 12 months: no 12. History of any prior anesthesia complications: yes, n/v-hysterectomy 13. Body mass index is 39.72 kg/m.    MEDICATIONS & ALLERGIES:    Patient reports the following regarding taking any blood thinners:   Plavix? no Aspirin? no Coumadin? no Brilinta? no Xarelto? no Eliquis? no Pradaxa? no Savaysa? no Effient? no  Patient confirms/reports the following medications:  Current Outpatient Medications  Medication Sig Dispense Refill   acetaminophen (TYLENOL) 500 MG tablet Take 1,000 mg by mouth as needed for mild pain.     albuterol (VENTOLIN HFA) 108 (90 Base) MCG/ACT inhaler Inhale 2 puffs into the lungs as needed.     cholecalciferol (VITAMIN D) 25 MCG (1000 UNIT) tablet Take 1,000 Units by mouth daily.     clobetasol (TEMOVATE) 0.05 % external solution Apply 1 application topically daily as needed (Scalp itching).     fexofenadine (ALLEGRA) 180 MG tablet Take 180 mg by mouth daily as needed for allergies.     halobetasol (ULTRAVATE) 0.05 % cream Apply 1 application topically daily as needed (eczema).     hydrochlorothiazide (HYDRODIURIL) 25 MG tablet Take 25 mg  by mouth daily.     lisinopril (ZESTRIL) 20 MG tablet Take 20 mg by mouth at bedtime.     LORazepam (ATIVAN) 0.5 MG tablet Take 0.5 mg by mouth daily.      rosuvastatin (CRESTOR) 10 MG tablet Take 10 mg by mouth at bedtime.     No current facility-administered medications for this visit.    Patient confirms/reports the following allergies:  No Known Allergies  No orders of the defined types were placed in this encounter.   AUTHORIZATION INFORMATION Primary Insurance: Medicare,  ID #: [id number] Pre-Cert / Auth required: No, not required  Secondary Insurance: [place],  [place] #: [id number] Pre-Cert / Josem Kaufmann required: No, not required  SCHEDULE INFORMATION: Procedure has been scheduled as follows:  Date: 04/14/2021, Time: 2:00  Location: [facility] with Dr. Gala Romney  This Gastroenterology Pre-Precedure Review Form is being routed to the following provider(s): Roseanne Kaufman, NP

## 2021-03-22 NOTE — Progress Notes (Signed)
Appropriate. ASA 2.  

## 2021-04-14 ENCOUNTER — Encounter (HOSPITAL_COMMUNITY): Payer: Self-pay | Admitting: Internal Medicine

## 2021-04-14 ENCOUNTER — Encounter (HOSPITAL_COMMUNITY)
Admission: RE | Disposition: A | Discharge status: Home / Self Care | Payer: Self-pay | Source: Home / Self Care | Attending: Internal Medicine

## 2021-04-14 ENCOUNTER — Ambulatory Visit (HOSPITAL_COMMUNITY)
Admission: RE | Admit: 2021-04-14 | Discharge: 2021-04-14 | Disposition: A | Discharge status: Home / Self Care | Payer: Medicare Other | Attending: Internal Medicine | Admitting: Internal Medicine

## 2021-04-14 ENCOUNTER — Other Ambulatory Visit: Payer: Self-pay

## 2021-04-14 DIAGNOSIS — K5730 Diverticulosis of large intestine without perforation or abscess without bleeding: Secondary | ICD-10-CM | POA: Diagnosis not present

## 2021-04-14 DIAGNOSIS — F17210 Nicotine dependence, cigarettes, uncomplicated: Secondary | ICD-10-CM | POA: Diagnosis not present

## 2021-04-14 DIAGNOSIS — Z1211 Encounter for screening for malignant neoplasm of colon: Secondary | ICD-10-CM | POA: Insufficient documentation

## 2021-04-14 DIAGNOSIS — D120 Benign neoplasm of cecum: Secondary | ICD-10-CM | POA: Insufficient documentation

## 2021-04-14 DIAGNOSIS — D124 Benign neoplasm of descending colon: Secondary | ICD-10-CM

## 2021-04-14 HISTORY — PX: COLONOSCOPY: SHX5424

## 2021-04-14 HISTORY — PX: POLYPECTOMY: SHX5525

## 2021-04-14 SURGERY — COLONOSCOPY
Anesthesia: Moderate Sedation

## 2021-04-14 MED ORDER — ONDANSETRON HCL 4 MG/2ML IJ SOLN
INTRAMUSCULAR | Status: AC
  Filled 2021-04-14: qty 2

## 2021-04-14 MED ORDER — MEPERIDINE HCL 100 MG/ML IJ SOLN
INTRAMUSCULAR | Status: DC | PRN
  Administered 2021-04-14: 40 mg
  Administered 2021-04-14: 10 mg

## 2021-04-14 MED ORDER — MIDAZOLAM HCL 5 MG/5ML IJ SOLN
INTRAMUSCULAR | Status: DC | PRN
  Administered 2021-04-14: 1 mg via INTRAVENOUS
  Administered 2021-04-14 (×2): 2 mg via INTRAVENOUS
  Administered 2021-04-14 (×2): 1 mg via INTRAVENOUS

## 2021-04-14 MED ORDER — MEPERIDINE HCL 50 MG/ML IJ SOLN
INTRAMUSCULAR | Status: AC
  Filled 2021-04-14: qty 1

## 2021-04-14 MED ORDER — ONDANSETRON HCL 4 MG/2ML IJ SOLN
INTRAMUSCULAR | Status: DC | PRN
  Administered 2021-04-14: 4 mg via INTRAVENOUS

## 2021-04-14 MED ORDER — MIDAZOLAM HCL 5 MG/5ML IJ SOLN
INTRAMUSCULAR | Status: AC
  Filled 2021-04-14: qty 10

## 2021-04-14 MED ORDER — SODIUM CHLORIDE 0.9 % IV SOLN: INTRAVENOUS | Status: DC

## 2021-04-14 NOTE — Op Note (Signed)
[facility] Patient Name: Kimberly Bowen Procedure Date: 04/14/2021 1:10 PM MRN: [id number] Date of Birth: 1950-08-15 Attending MD: Norvel Richards , MD CSN: [id number] Age: 70 Admit Type: Outpatient Procedure:                Colonoscopy Indications:              Screening for colorectal malignant neoplasm Providers:                Norvel Richards, MD, Lambert Mody, Casimer Bilis, Technician Referring MD:              Medicines:                Midazolam 4 mg IV, Meperidine 50 mg IV Complications:            No immediate complications. Estimated Blood Loss:     Estimated blood loss was minimal. Procedure:                Pre-Anesthesia Assessment:                           - Prior to the procedure, a History and Physical                            was performed, and patient medications and                            allergies were reviewed. The patient's tolerance of                            previous anesthesia was also reviewed. The risks                            and benefits of the procedure and the sedation                            options and risks were discussed with the patient.                            All questions were answered, and informed consent                            was obtained. Prior Anticoagulants: The patient has                            taken no previous anticoagulant or antiplatelet                            agents. ASA Grade Assessment: II - A patient with                            mild systemic disease. After reviewing the risks  and benefits, the patient was deemed in                            satisfactory condition to undergo the procedure.                           After obtaining informed consent, the colonoscope                            was passed under direct vision. Throughout the                            procedure, the patient's blood pressure, pulse, and                             oxygen saturations were monitored continuously. The                            [id number]) scope was introduced through the                            anus and advanced to the the cecum, identified by                            appendiceal orifice and ileocecal valve. The                            colonoscopy was performed without difficulty. The                            patient tolerated the procedure well. The quality                            of the bowel preparation was adequate. Scope In: 1:31:26 PM Scope Out: 1:56:49 PM Scope Withdrawal Time: 0 hours 17 minutes 51 seconds  Total Procedure Duration: 0 hours 25 minutes 23 seconds  Findings:      The perianal and digital rectal examinations were normal.      Scattered medium-mouthed diverticula were found in the entire colon.      Ten sessile polyps were found in the descending colon and cecum. The       polyps were 3 to 6 mm in size. Rectal mucosa seen well on face. The       remainder the colonic mucosa appeared normal. Rectal vault too small to       retroflex. Rectal mucosa appeared normal. These polyps were removed with       a cold snare. Resection and retrieval were complete. Estimated blood       loss was minimal. Impression:               - Diverticulosis in the entire examined colon.                           - Ten 3 to 6 mm polyps in the descending colon and  in the cecum.                           - . Moderate Sedation:      Moderate (conscious) sedation was administered by the endoscopy nurse       and supervised by the endoscopist. The following parameters were       monitored: oxygen saturation, heart rate, blood pressure, respiratory       rate, EKG, adequacy of pulmonary ventilation, and response to care.       Total physician intraservice time was 30 minutes. Recommendation:           - Patient has a contact number available for                             emergencies. The signs and symptoms of potential                            delayed complications were discussed with the                            patient. Return to normal activities tomorrow.                            Written discharge instructions were provided to the                            patient.                           - Advance diet as tolerated.                           - Continue present medications.                           - Repeat colonoscopy date to be determined after                            pending pathology results are reviewed for                            surveillance.                           - Return to GI office (date not yet determined). Procedure Code(s):        --- Professional ---                           [phone], Colonoscopy, flexible; with removal of                            tumor(s), polyp(s), or other lesion(s) by snare                            technique  34193, Moderate sedation; each additional 15                            minutes intraservice time                           G0500, Moderate sedation services provided by the                            same physician or other qualified health care                            professional performing a gastrointestinal                            endoscopic service that sedation supports,                            requiring the presence of an independent trained                            observer to assist in the monitoring of the                            patient's level of consciousness and physiological                            status; initial 15 minutes of intra-service time;                            patient age 56 years or older (additional time may                            be reported with [phone], as appropriate) Diagnosis Code(s):        --- Professional ---                           Z12.11, Encounter for screening for malignant                             neoplasm of colon                           K63.5, Polyp of colon                           K57.30, Diverticulosis of large intestine without                            perforation or abscess without bleeding CPT copyright 2019 [employer]. All rights reserved. The codes documented in this report are preliminary and upon coder review may  be revised to meet current compliance requirements. Cristopher Estimable. Kaytie Ratcliffe, MD Norvel Richards, MD 04/14/2021 2:03:26 PM This report has been signed electronically. Number of Addenda: 0

## 2021-04-14 NOTE — H&P (Signed)
@LOGO @   Primary Care Physician:  Asencion Noble, MD Primary Gastroenterologist:  Dr. Gala Romney  Pre-Procedure History & Physical: HPI:  Kimberly Bowen is a 70 y.o. female here for Screening colonoscopy.  Hyperplastic polyp removed from her colon in 2015.   Grandmother reported to have possibly had colon cancer at advanced age.    No bowel symptoms. Past Medical History:  Diagnosis Date   Anxiety    Colon polyps    multiple hyperplastic polyps   COPD (chronic obstructive pulmonary disease) (HCC)    Eczema    Hyperlipidemia    not on med   Hypertension    Seasonal allergies     Past Surgical History:  Procedure Laterality Date   CATARACT EXTRACTION W/PHACO Left 09/09/2019   Procedure: CATARACT EXTRACTION PHACO AND INTRAOCULAR LENS PLACEMENT (IOC) CDE:  8.15;  Surgeon: [name], MD;  Location: [facility];  Service: Ophthalmology;  Laterality: Left;   CATARACT EXTRACTION W/PHACO Right 09/23/2019   Procedure: CATARACT EXTRACTION PHACO AND INTRAOCULAR LENS PLACEMENT RIGHT EYE;  Surgeon: [name], MD;  Location: [facility];  Service: Ophthalmology;  Laterality: Right;  CDE: 7.94   COLONOSCOPY  09/26/2006     [id number] rectum, pan colonic diverticula/Multiple colonic polyps removed and/or destroyed, as described above (hyperplastic)   COLONOSCOPY N/A 02/11/2014   Procedure: COLONOSCOPY;  Surgeon: Daneil Dolin, MD;  Location: [facility];  Service: Endoscopy;  Laterality: N/A;  1:00    VAGINAL HYSTERECTOMY      Prior to Admission medications   Medication Sig Start Date End Date Taking? Authorizing Provider  acetaminophen (TYLENOL) 500 MG tablet Take 1,000 mg by mouth as needed for headache.   Yes [provider]  albuterol (VENTOLIN HFA) 108 (90 Base) MCG/ACT inhaler Inhale 2 puffs into the lungs every 6 (six) hours as needed for shortness of breath. 07/14/19  Yes [provider]  clobetasol (TEMOVATE) 0.05 % external solution Apply 1 application topically daily as  needed (scalp).   Yes [provider]  FLOVENT HFA 44 MCG/ACT inhaler Inhale 2 puffs into the lungs 2 (two) times daily. 02/23/21  Yes [provider]  hydrochlorothiazide (HYDRODIURIL) 25 MG tablet Take 25 mg by mouth daily. 08/13/19  Yes [provider]  lisinopril (ZESTRIL) 20 MG tablet Take 20 mg by mouth at bedtime. 07/12/19  Yes [provider]  LORazepam (ATIVAN) 0.5 MG tablet Take 0.5 mg by mouth daily.  07/12/19  Yes [provider]  rosuvastatin (CRESTOR) 10 MG tablet Take 10 mg by mouth at bedtime. 02/27/21  Yes [provider]    Allergies as of 03/22/2021   (No Known Allergies)    Family History  Problem Relation Age of Onset   Colon cancer Other    Leukemia Mother    Lupus Sister    Breast cancer Maternal Aunt        age 59   Cancer Cousin        three first cousins died of cancer    Social History   Socioeconomic History   Marital status: Married    Spouse name: Not on file   Number of children: 2   Years of education: Not on file   Highest education level: Not on file  Occupational History    Employer: [employer]  Tobacco Use   Smoking status: Every Day    Packs/day: 1.00    Years: 40.00    Pack years: 40.00    Types: Cigarettes   Smokeless  tobacco: Never   Tobacco comments:    1/2 pack daily  Substance and Sexual Activity   Alcohol use: No   Drug use: No   Sexual activity: Not on file  Other Topics Concern   Not on file  Social History Narrative   Not on file   Social Determinants of Health   Financial Resource Strain: Not on file  Food Insecurity: Not on file  Transportation Needs: Not on file  Physical Activity: Not on file  Stress: Not on file  Social Connections: Not on file  Intimate Partner Violence: Not on file    Review of Systems: See HPI, otherwise negative ROS  Physical Exam: There were no vitals taken for this visit. General:   Alert,  Well-developed, well-nourished,  pleasant and cooperative in NAD Neck:  Supple; no masses or thyromegaly. No significant cervical adenopathy. Lungs:  Clear throughout to auscultation.   No wheezes, crackles, or rhonchi. No acute distress. Heart:  Regular rate and rhythm; no murmurs, clicks, rubs,  or gallops. Abdomen: Non-distended, normal bowel sounds.  Soft and nontender without appreciable mass or hepatosplenomegaly.  Pulses:  Normal pulses noted. Extremities:  Without clubbing or edema.  Impression/Plan:    70 year old lady here for screening colonoscopy.  The risks, benefits, limitations, alternatives and imponderables have been reviewed with the patient. Questions have been answered. All parties are agreeable.       Notice: This dictation was prepared with Dragon dictation along with smaller phrase technology. Any transcriptional errors that result from this process are unintentional and may not be corrected upon review.

## 2021-04-14 NOTE — Discharge Instructions (Addendum)
°  Colonoscopy Discharge Instructions  Read the instructions outlined below and refer to this sheet in the next few weeks. These discharge instructions provide you with general information on caring for yourself after you leave the hospital. Your doctor may also give you specific instructions. While your treatment has been planned according to the most current medical practices available, unavoidable complications occasionally occur. If you have any problems or questions after discharge, call Dr. Gala Romney at [phone]. ACTIVITY You may resume your regular activity, but move at a slower pace for the next 24 hours.  Take frequent rest periods for the next 24 hours.  Walking will help get rid of the air and reduce the bloated feeling in your belly (abdomen).  No driving for 24 hours (because of the medicine (anesthesia) used during the test).   Do not sign any important legal documents or operate any machinery for 24 hours (because of the anesthesia used during the test).  NUTRITION Drink plenty of fluids.  You may resume your normal diet as instructed by your doctor.  Begin with a light meal and progress to your normal diet. Heavy or fried foods are harder to digest and may make you feel sick to your stomach (nauseated).  Avoid alcoholic beverages for 24 hours or as instructed.  MEDICATIONS You may resume your normal medications unless your doctor tells you otherwise.  WHAT YOU CAN EXPECT TODAY Some feelings of bloating in the abdomen.  Passage of more gas than usual.  Spotting of blood in your stool or on the toilet paper.  IF YOU HAD POLYPS REMOVED DURING THE COLONOSCOPY: No aspirin products for 7 days or as instructed.  No alcohol for 7 days or as instructed.  Eat a soft diet for the next 24 hours.  FINDING OUT THE RESULTS OF YOUR TEST Not all test results are available during your visit. If your test results are not back during the visit, make an appointment with your caregiver to find out the  results. Do not assume everything is normal if you have not heard from your caregiver or the medical facility. It is important for you to follow up on all of your test results.  SEEK IMMEDIATE MEDICAL ATTENTION IF: You have more than a spotting of blood in your stool.  Your belly is swollen (abdominal distention).  You are nauseated or vomiting.  You have a temperature over 101.  You have abdominal pain or discomfort that is severe or gets worse throughout the day.    10 small polyps removed in your colon   colon polyp and diverticulosis information provided   further recommendations to follow pending review of pathology report  At patient request I called Marygrace Sandoval at [phone] findings and recommendation

## 2021-04-15 LAB — SURGICAL PATHOLOGY

## 2021-04-16 ENCOUNTER — Encounter: Payer: Self-pay | Admitting: Internal Medicine

## 2021-04-19 ENCOUNTER — Encounter (HOSPITAL_COMMUNITY): Payer: Self-pay | Admitting: Internal Medicine

## 2021-06-01 ENCOUNTER — Other Ambulatory Visit (HOSPITAL_COMMUNITY): Payer: Self-pay | Admitting: Internal Medicine

## 2021-06-01 DIAGNOSIS — Z1231 Encounter for screening mammogram for malignant neoplasm of breast: Secondary | ICD-10-CM

## 2021-07-09 ENCOUNTER — Ambulatory Visit (HOSPITAL_COMMUNITY)
Admission: RE | Admit: 2021-07-09 | Discharge: 2021-07-09 | Disposition: A | Discharge status: Home / Self Care | Payer: Medicare Other | Source: Ambulatory Visit | Attending: Internal Medicine | Admitting: Internal Medicine

## 2021-07-09 ENCOUNTER — Other Ambulatory Visit: Payer: Self-pay

## 2021-07-09 DIAGNOSIS — Z1231 Encounter for screening mammogram for malignant neoplasm of breast: Secondary | ICD-10-CM

## 2021-08-27 ENCOUNTER — Other Ambulatory Visit (HOSPITAL_COMMUNITY): Payer: Self-pay | Admitting: Internal Medicine

## 2021-08-27 DIAGNOSIS — N951 Menopausal and female climacteric states: Secondary | ICD-10-CM

## 2021-09-06 ENCOUNTER — Ambulatory Visit (HOSPITAL_COMMUNITY)
Admission: RE | Admit: 2021-09-06 | Discharge: 2021-09-06 | Disposition: A | Discharge status: Home / Self Care | Payer: Medicare Other | Source: Ambulatory Visit | Attending: Internal Medicine | Admitting: Internal Medicine

## 2021-09-06 DIAGNOSIS — N951 Menopausal and female climacteric states: Secondary | ICD-10-CM | POA: Diagnosis present

## 2021-09-06 DIAGNOSIS — Z13820 Encounter for screening for osteoporosis: Secondary | ICD-10-CM | POA: Insufficient documentation

## 2021-09-06 DIAGNOSIS — M8589 Other specified disorders of bone density and structure, multiple sites: Secondary | ICD-10-CM | POA: Diagnosis not present

## 2022-07-12 ENCOUNTER — Ambulatory Visit (INDEPENDENT_AMBULATORY_CARE_PROVIDER_SITE_OTHER): Payer: Medicare Other

## 2022-07-12 ENCOUNTER — Ambulatory Visit (INDEPENDENT_AMBULATORY_CARE_PROVIDER_SITE_OTHER): Payer: Medicare Other | Admitting: Orthopaedic Surgery

## 2022-07-12 ENCOUNTER — Encounter: Payer: Self-pay | Admitting: Orthopaedic Surgery

## 2022-07-12 VITALS — BP 118/83 | HR 85 | Ht 63.0 in | Wt 204.4 lb

## 2022-07-12 DIAGNOSIS — M5442 Lumbago with sciatica, left side: Secondary | ICD-10-CM

## 2022-07-12 DIAGNOSIS — F17210 Nicotine dependence, cigarettes, uncomplicated: Secondary | ICD-10-CM

## 2022-07-12 DIAGNOSIS — M5441 Lumbago with sciatica, right side: Secondary | ICD-10-CM

## 2022-07-12 DIAGNOSIS — G8929 Other chronic pain: Secondary | ICD-10-CM | POA: Diagnosis not present

## 2022-07-12 MED ORDER — PREDNISONE 5 MG (21) PO TBPK: ORAL_TABLET | ORAL | 0 refills | Status: DC

## 2022-07-12 NOTE — Patient Instructions (Addendum)
 CONTINUE THE BACK EXERCISES AS DISCUSSED DURING YOUR VISIT  You have been prescribed Naproxen '500mg'$  by mouth twice daily. You must take this medication WITH food. If you only have coffee for breakfast that isn't a meal. Wait until you eat. It will take about 10 days for this to fully get into your system.   YOU HAVE BEEN PRESCRIBED ORAL PREDNISONE  DAY 1: TAKE 6 TABLETS BY MOUTH, TAKE THEM ALL AT ONE TIME DAY 2: TAKE 5 TABLETS BY MOUTH, TAKE THEM ALL AT ONE TIME DAY 3: TAKE 4 TABLETS BY MOUTH, TAKE THEM ALL AT ONE TIME DAY 4: TAKE 3 TABLETS BY MOUTH, TAKE THEM ALL AT ONE TIME DAY 5: TAKE 2 TABLETS BY MOUTH, TAKE THEM ALL AT ONE TIME DAY 6: TAKE 1 TABLET BY MOUTH  DR.KEELING'S SCHEDULE IS AS FOLLOWS: TUESDAY: ALL DAY WEDNESDAY: MORNING ONLY THURSDAY: MORNING ONLY PLEASE CALL OR SEND A MESSAGE VIA MYCHART BY WEDNESDAY MORNING SO THAT I CAN SEND A REQUEST TO HIM. HE LEAVES THURSDAY BY 11:30AM. HE DOES NOT RETURN TO THE OFFICE UNTIL TUESDAY MORNINGS AND HE DOES NOT CHECK HIS WORK MESSAGES DURING HIS TIME AWAY FROM THE OFFICE. I'M  AND IF YOU NEED SOMETHING, SEND ME A MYCHART MESSAGE OR CALL. MYCHART IS THE FASTEST WAY TO GET IN CONTACT WITH ME.

## 2022-07-12 NOTE — Progress Notes (Signed)
Subjective:    Patient ID: Kimberly Bowen, female    DOB: [birth year], 72 y.o.   MRN: [id number]  HPI She has had pain running to the left hip from lower back for about a month and now the pain runs down to the left foot at times.  She has had lower back pain in the past and feels like it has returned. She had MRI of the lumbar spine in 2015 but has done well until now.  She has no trauma, no weakness, no bowel or bladder problems.  She has seen Dr. Willey Blade.  He has given her pain medicine which helps some.  Her husband looked up on internet and had her do some lower back exercises which have helped a lot.  She is in less pain now.  She takes Ibuprofen 800 twice a day which helps.  I have reviewed her primary care notes.   Review of Systems  Constitutional:  Positive for activity change.  Musculoskeletal:  Positive for arthralgias and back pain.  Allergic/Immunologic: Positive for environmental allergies.  All other systems reviewed and are negative. For Review of Systems, all other systems reviewed and are negative.  The following is a summary of the past history medically, past history surgically, known current medicines, social history and family history.  This information is gathered electronically by the computer from prior information and documentation.  I review this each visit and have found including this information at this point in the chart is beneficial and informative.   Past Medical History:  Diagnosis Date   Anxiety    Colon polyps    multiple hyperplastic polyps   COPD (chronic obstructive pulmonary disease) (HCC)    Eczema    Hyperlipidemia    not on med   Hypertension    Seasonal allergies     Past Surgical History:  Procedure Laterality Date   CATARACT EXTRACTION W/PHACO Left 09/09/2019   Procedure: CATARACT EXTRACTION PHACO AND INTRAOCULAR LENS PLACEMENT (IOC) CDE:  8.15;  Surgeon: [name], MD;  Location: [facility];  Service: Ophthalmology;  Laterality: Left;    CATARACT EXTRACTION W/PHACO Right 09/23/2019   Procedure: CATARACT EXTRACTION PHACO AND INTRAOCULAR LENS PLACEMENT RIGHT EYE;  Surgeon: [name], MD;  Location: [facility];  Service: Ophthalmology;  Laterality: Right;  CDE: 7.94   COLONOSCOPY  09/26/2006     [id number] rectum, pan colonic diverticula/Multiple colonic polyps removed and/or destroyed, as described above (hyperplastic)   COLONOSCOPY N/A 02/11/2014   Procedure: COLONOSCOPY;  Surgeon: Daneil Dolin, MD;  Location: [facility];  Service: Endoscopy;  Laterality: N/A;  1:00    COLONOSCOPY N/A 04/14/2021   Procedure: COLONOSCOPY;  Surgeon: Daneil Dolin, MD;  Location: [facility];  Service: Endoscopy;  Laterality: N/A;  2:00   POLYPECTOMY  04/14/2021   Procedure: POLYPECTOMY;  Surgeon: Daneil Dolin, MD;  Location: [facility];  Service: Endoscopy;;   VAGINAL HYSTERECTOMY      Current Outpatient Medications on File Prior to Visit  Medication Sig Dispense Refill   acetaminophen (TYLENOL) 500 MG tablet Take 1,000 mg by mouth as needed for headache.     albuterol (VENTOLIN HFA) 108 (90 Base) MCG/ACT inhaler Inhale 2 puffs into the lungs every 6 (six) hours as needed for shortness of breath.     clobetasol (TEMOVATE) 0.05 % external solution Apply 1 application topically daily as needed (scalp).     FLOVENT HFA 44 MCG/ACT inhaler Inhale 2 puffs into the lungs 2 (  two) times daily.     hydrochlorothiazide (HYDRODIURIL) 25 MG tablet Take 25 mg by mouth daily.     lisinopril (ZESTRIL) 20 MG tablet Take 20 mg by mouth at bedtime.     LORazepam (ATIVAN) 0.5 MG tablet Take 0.5 mg by mouth daily.      rosuvastatin (CRESTOR) 10 MG tablet Take 10 mg by mouth at bedtime.     No current facility-administered medications on file prior to visit.    Social History   Socioeconomic History   Marital status: Married    Spouse name: Not on file   Number of children: 2   Years of education: Not on file   Highest education level:  Not on file  Occupational History    Employer: [employer]  Tobacco Use   Smoking status: Every Day    Packs/day: 1.00    Years: 40.00    Total pack years: 40.00    Types: Cigarettes   Smokeless tobacco: Never   Tobacco comments:    1/2 pack daily  Substance and Sexual Activity   Alcohol use: No   Drug use: No   Sexual activity: Not on file  Other Topics Concern   Not on file  Social History Narrative   Not on file   Social Determinants of Health   Financial Resource Strain: Not on file  Food Insecurity: Not on file  Transportation Needs: Not on file  Physical Activity: Not on file  Stress: Not on file  Social Connections: Not on file  Intimate Partner Violence: Not on file    Family History  Problem Relation Age of Onset   Colon cancer Other    Leukemia Mother    Lupus Sister    Breast cancer Maternal Aunt        age 38   Cancer Cousin        three first cousins died of cancer    BP 118/83   Pulse 85   Ht '5\' 3"'$  (1.6 m)   Wt 204 lb 6.4 oz (92.7 kg)   BMI 36.21 kg/m   Body mass index is 36.21 kg/m.      Objective:   Physical Exam Vitals and nursing note reviewed. Exam conducted with a chaperone present.  Constitutional:      Appearance: She is well-developed.  HENT:     Head: Normocephalic and atraumatic.  Eyes:     Conjunctiva/sclera: Conjunctivae normal.     Pupils: Pupils are equal, round, and reactive to light.  Cardiovascular:     Rate and Rhythm: Normal rate and regular rhythm.  Pulmonary:     Effort: Pulmonary effort is normal.  Abdominal:     Palpations: Abdomen is soft.  Musculoskeletal:       Arms:     Cervical back: Normal range of motion and neck supple.  Skin:    General: Skin is warm and dry.  Neurological:     Mental Status: She is alert and oriented to person, place, and time.     Cranial Nerves: No cranial nerve deficit.     Motor: No abnormal muscle tone.     Coordination: Coordination normal.     Deep Tendon Reflexes:  Reflexes are normal and symmetric. Reflexes normal.  Psychiatric:        Behavior: Behavior normal.        Thought Content: Thought content normal.        Judgment: Judgment normal.   X-rays were done of the lumbar spine,  reported separately.        Assessment & Plan:   Encounter Diagnoses  Name Primary?   Chronic left-sided low back pain with left-sided sciatica Yes   Nicotine dependence, cigarettes, uncomplicated     I will begin prednisone dose pack.  Stop the ibuprofen while on the prednisone and then resume afterwards.  Continue the exercises.  Return in two weeks.  She may need MRI if not improved.  Call if any problem.  Precautions discussed.  Electronically Signed Sanjuana Kava, MD 3/12/202410:55 AM

## 2022-07-21 ENCOUNTER — Telehealth: Payer: Self-pay | Admitting: Orthopaedic Surgery

## 2022-07-21 NOTE — Telephone Encounter (Signed)
Dr. Brooke Bonito patient - The patient lvm requesting something for pain to be sent to [facility].

## 2022-07-22 MED ORDER — HYDROCODONE-ACETAMINOPHEN 5-325 MG PO TABS: ORAL_TABLET | ORAL | 0 refills | Status: DC

## 2022-07-26 ENCOUNTER — Ambulatory Visit (INDEPENDENT_AMBULATORY_CARE_PROVIDER_SITE_OTHER): Payer: Medicare Other | Admitting: Orthopaedic Surgery

## 2022-07-26 ENCOUNTER — Encounter: Payer: Self-pay | Admitting: Orthopaedic Surgery

## 2022-07-26 VITALS — BP 97/57 | HR 99 | Ht 63.0 in | Wt 200.0 lb

## 2022-07-26 DIAGNOSIS — M5442 Lumbago with sciatica, left side: Secondary | ICD-10-CM

## 2022-07-26 DIAGNOSIS — G8929 Other chronic pain: Secondary | ICD-10-CM | POA: Diagnosis not present

## 2022-07-26 DIAGNOSIS — F17210 Nicotine dependence, cigarettes, uncomplicated: Secondary | ICD-10-CM | POA: Diagnosis not present

## 2022-07-26 NOTE — Progress Notes (Signed)
My back is still hurting.  She took the prednisone and it helped some but her pain has returned in the lower back. She has been doing the exercises with little help. She has no new trauma, no weakness.  Spine/Pelvis examination:  Inspection:  Overall, sacoiliac joint benign and hips nontender; without crepitus or defects.   Thoracic spine inspection: Alignment normal without kyphosis present   Lumbar spine inspection:  Alignment  with normal lumbar lordosis, without scoliosis apparent.   Thoracic spine palpation:  without tenderness of spinal processes   Lumbar spine palpation: without tenderness of lumbar area; without tightness of lumbar muscles    Range of Motion:   Lumbar flexion, forward flexion is normal without pain or tenderness    Lumbar extension is full without pain or tenderness   Left lateral bend is normal without pain or tenderness   Right lateral bend is normal without pain or tenderness   Straight leg raising is normal  Strength & tone: normal   Stability overall normal stability  Encounter Diagnoses  Name Primary?   Chronic left-sided low back pain with left-sided sciatica Yes   Nicotine dependence, cigarettes, uncomplicated    Get MRI of the lumbar spine.  Return in three weeks.  Call if any problem.  Precautions discussed.  Electronically Signed Sanjuana Kava, MD 3/26/20249:33 AM

## 2022-07-26 NOTE — Patient Instructions (Signed)
Central Scheduling [phone]  While we are working on your approval please go ahead and call to schedule your appointment to be done within one week. Once I have gotten the imaging approved for the facility you choose, I can't change it. [facility] is usually booked out for several weeks with imaging appointments.   

## 2022-08-16 ENCOUNTER — Ambulatory Visit: Payer: Medicare Other | Admitting: Orthopaedic Surgery

## 2022-08-22 ENCOUNTER — Other Ambulatory Visit (HOSPITAL_COMMUNITY): Payer: Self-pay | Admitting: Internal Medicine

## 2022-08-22 ENCOUNTER — Ambulatory Visit (HOSPITAL_COMMUNITY)
Admission: RE | Admit: 2022-08-22 | Discharge: 2022-08-22 | Disposition: A | Discharge status: Home / Self Care | Payer: Medicare Other | Source: Ambulatory Visit | Attending: Orthopaedic Surgery | Admitting: Orthopaedic Surgery

## 2022-08-22 DIAGNOSIS — G8929 Other chronic pain: Secondary | ICD-10-CM | POA: Diagnosis present

## 2022-08-22 DIAGNOSIS — M5442 Lumbago with sciatica, left side: Secondary | ICD-10-CM | POA: Diagnosis present

## 2022-08-22 DIAGNOSIS — Z1231 Encounter for screening mammogram for malignant neoplasm of breast: Secondary | ICD-10-CM

## 2022-08-24 ENCOUNTER — Ambulatory Visit (HOSPITAL_COMMUNITY)
Admission: RE | Admit: 2022-08-24 | Discharge: 2022-08-24 | Disposition: A | Discharge status: Home / Self Care | Payer: Medicare Other | Source: Ambulatory Visit | Attending: Internal Medicine | Admitting: Internal Medicine

## 2022-08-24 DIAGNOSIS — Z1231 Encounter for screening mammogram for malignant neoplasm of breast: Secondary | ICD-10-CM | POA: Diagnosis not present

## 2022-09-06 ENCOUNTER — Ambulatory Visit (INDEPENDENT_AMBULATORY_CARE_PROVIDER_SITE_OTHER): Payer: Medicare Other | Admitting: Orthopaedic Surgery

## 2022-09-06 ENCOUNTER — Encounter: Payer: Self-pay | Admitting: Orthopaedic Surgery

## 2022-09-06 VITALS — BP 96/64 | HR 68 | Ht 63.0 in | Wt 200.0 lb

## 2022-09-06 DIAGNOSIS — F17210 Nicotine dependence, cigarettes, uncomplicated: Secondary | ICD-10-CM | POA: Diagnosis not present

## 2022-09-06 DIAGNOSIS — G8929 Other chronic pain: Secondary | ICD-10-CM

## 2022-09-06 DIAGNOSIS — M5442 Lumbago with sciatica, left side: Secondary | ICD-10-CM

## 2022-09-06 NOTE — Progress Notes (Signed)
I feel better.  She had the MRI of the lumbar spine showing: IMPRESSION: 1. At L4-5 there is a broad-based disc bulge with a broad central disc protrusion. Mild bilateral facet arthropathy. Mild left foraminal stenosis. 2. At L3-4 there is a broad-based disc bulge. Moderate bilateral facet arthropathy. Mild spinal stenosis. 3. At L2-3 there is a mild broad-based disc bulge. Mild bilateral facet arthropathy. Mild spinal stenosis. 4. No acute osseous injury of the lumbar spine.  She feels better.  She has less pain.  She is more active.  I have explained the findings of the MRI to her.  I have recommended she proceed with activities but with moderation.  I have independently reviewed the MRI.    Spine/Pelvis examination:  Inspection:  Overall, sacoiliac joint benign and hips nontender; without crepitus or defects.   Thoracic spine inspection: Alignment normal without kyphosis present   Lumbar spine inspection:  Alignment  with normal lumbar lordosis, without scoliosis apparent.   Thoracic spine palpation:  without tenderness of spinal processes   Lumbar spine palpation: without tenderness of lumbar area; without tightness of lumbar muscles    Range of Motion:   Lumbar flexion, forward flexion is normal without pain or tenderness    Lumbar extension is full without pain or tenderness   Left lateral bend is normal without pain or tenderness   Right lateral bend is normal without pain or tenderness   Straight leg raising is normal  Strength & tone: normal   Stability overall normal stability Encounter Diagnoses  Name Primary?   Chronic left-sided low back pain with left-sided sciatica Yes   Nicotine dependence, cigarettes, uncomplicated    Return in two months.  Do her exercises.  Call if any problem.  Precautions discussed.  Electronically Signed Darreld Mclean, MD 5/7/20248:30 AM;

## 2022-11-08 ENCOUNTER — Ambulatory Visit (INDEPENDENT_AMBULATORY_CARE_PROVIDER_SITE_OTHER): Payer: Medicare Other | Admitting: Orthopaedic Surgery

## 2022-11-08 ENCOUNTER — Encounter: Payer: Self-pay | Admitting: Orthopaedic Surgery

## 2022-11-08 DIAGNOSIS — F17210 Nicotine dependence, cigarettes, uncomplicated: Secondary | ICD-10-CM

## 2022-11-08 DIAGNOSIS — G8929 Other chronic pain: Secondary | ICD-10-CM

## 2022-11-08 DIAGNOSIS — M5442 Lumbago with sciatica, left side: Secondary | ICD-10-CM | POA: Diagnosis not present

## 2022-11-08 NOTE — Progress Notes (Signed)
My back is better.  She had the MRI of the lumbar spine showing: IMPRESSION: 1. At L4-5 there is a broad-based disc bulge with a broad central disc protrusion. Mild bilateral facet arthropathy. Mild left foraminal stenosis. 2. At L3-4 there is a broad-based disc bulge. Moderate bilateral facet arthropathy. Mild spinal stenosis. 3. At L2-3 there is a mild broad-based disc bulge. Mild bilateral facet arthropathy. Mild spinal stenosis. 4. No acute osseous injury of the lumbar spine.  I have explained the results to her.  I have independently reviewed the MRI.    She is better.  She has little pain today.  Spine/Pelvis examination:  Inspection:  Overall, sacoiliac joint benign and hips nontender; without crepitus or defects.   Thoracic spine inspection: Alignment normal without kyphosis present   Lumbar spine inspection:  Alignment  with normal lumbar lordosis, without scoliosis apparent.   Thoracic spine palpation:  without tenderness of spinal processes   Lumbar spine palpation: without tenderness of lumbar area; without tightness of lumbar muscles    Range of Motion:   Lumbar flexion, forward flexion is normal without pain or tenderness    Lumbar extension is full without pain or tenderness   Left lateral bend is normal without pain or tenderness   Right lateral bend is normal without pain or tenderness   Straight leg raising is normal  Strength & tone: normal   Stability overall normal stability Encounter Diagnoses  Name Primary?   Chronic left-sided low back pain with left-sided sciatica Yes   Nicotine dependence, cigarettes, uncomplicated    I will see her prn.  Call if any problem.  Precautions discussed.  Electronically Signed Darreld Mclean, MD 7/9/20248:18 AM

## 2023-07-17 ENCOUNTER — Other Ambulatory Visit (HOSPITAL_COMMUNITY): Payer: Self-pay | Admitting: Internal Medicine

## 2023-07-17 DIAGNOSIS — Z1231 Encounter for screening mammogram for malignant neoplasm of breast: Secondary | ICD-10-CM

## 2023-08-25 ENCOUNTER — Ambulatory Visit (HOSPITAL_COMMUNITY)
Admission: RE | Admit: 2023-08-25 | Discharge: 2023-08-25 | Disposition: A | Discharge status: Home / Self Care | Source: Ambulatory Visit | Attending: Internal Medicine | Admitting: Internal Medicine

## 2023-08-25 ENCOUNTER — Encounter (HOSPITAL_COMMUNITY): Payer: Self-pay

## 2023-08-25 DIAGNOSIS — Z1231 Encounter for screening mammogram for malignant neoplasm of breast: Secondary | ICD-10-CM | POA: Diagnosis present

## 2023-09-13 ENCOUNTER — Other Ambulatory Visit (INDEPENDENT_AMBULATORY_CARE_PROVIDER_SITE_OTHER): Payer: Self-pay

## 2023-09-13 ENCOUNTER — Other Ambulatory Visit: Payer: Self-pay | Admitting: Orthopaedic Surgery

## 2023-09-13 ENCOUNTER — Ambulatory Visit (INDEPENDENT_AMBULATORY_CARE_PROVIDER_SITE_OTHER): Admitting: Orthopaedic Surgery

## 2023-09-13 ENCOUNTER — Encounter: Payer: Self-pay | Admitting: Orthopaedic Surgery

## 2023-09-13 VITALS — BP 99/64 | Ht 63.0 in | Wt 200.0 lb

## 2023-09-13 DIAGNOSIS — M25551 Pain in right hip: Secondary | ICD-10-CM

## 2023-09-13 DIAGNOSIS — M25552 Pain in left hip: Secondary | ICD-10-CM

## 2023-09-13 MED ORDER — DICLOFENAC SODIUM 75 MG PO TBEC: 75.0000 mg | DELAYED_RELEASE_TABLET | Freq: Two times a day (BID) | ORAL | 2 refills | Status: DC

## 2023-09-13 NOTE — Progress Notes (Signed)
 My right hip hurts.  She has had pain in the right hip for several months.  She has no trauma.  She has pain after walking a while and when trying to put on shoes.  She has no weakness or redness.  She does not like taking medicine and has not tried Tylenol  or Advil.  Right hip has slight decrease ROM compared to left, especially internal rotation.  Gait is good. NV intact.  X-rays were done of the right hip, reported separately.  Encounter Diagnosis  Name Primary?   Pain in right hip Yes   I will give diclofenac twice a day after eating.  Return in three weeks.  Call if any problem.  Precautions discussed.  Electronically Signed Pleasant Brilliant, MD 5/14/20259:41 AM

## 2023-10-04 ENCOUNTER — Ambulatory Visit: Admitting: Orthopaedic Surgery

## 2023-10-11 ENCOUNTER — Encounter: Payer: Self-pay | Admitting: Orthopaedic Surgery

## 2023-10-11 ENCOUNTER — Ambulatory Visit (INDEPENDENT_AMBULATORY_CARE_PROVIDER_SITE_OTHER): Admitting: Orthopaedic Surgery

## 2023-10-11 VITALS — BP 129/71 | HR 70 | Ht 63.0 in | Wt 200.0 lb

## 2023-10-11 DIAGNOSIS — M25551 Pain in right hip: Secondary | ICD-10-CM | POA: Diagnosis not present

## 2023-10-11 NOTE — Progress Notes (Signed)
 I am much better.  She has little pain of the right hip.  She did not take the diclofenac  as she has had stomach problems in the past and did not want to chance it.  She has full ROM of the right hip today, no pain.  Encounter Diagnosis  Name Primary?   Pain in right hip Yes   I will see prn.  Call if any problem.  Precautions discussed.  Electronically Signed Pleasant Brilliant, MD 6/11/20259:35 AM

## 2024-03-05 ENCOUNTER — Encounter (INDEPENDENT_AMBULATORY_CARE_PROVIDER_SITE_OTHER): Payer: Self-pay | Admitting: *Deleted

## 2024-05-07 ENCOUNTER — Other Ambulatory Visit: Payer: Self-pay | Admitting: *Deleted

## 2024-05-07 ENCOUNTER — Telehealth: Payer: Self-pay | Admitting: *Deleted

## 2024-05-07 ENCOUNTER — Ambulatory Visit: Admitting: Gastroenterology

## 2024-05-07 ENCOUNTER — Encounter: Payer: Self-pay | Admitting: *Deleted

## 2024-05-07 ENCOUNTER — Encounter: Payer: Self-pay | Admitting: Gastroenterology

## 2024-05-07 VITALS — BP 111/72 | HR 86 | Temp 97.6°F | Ht 62.0 in | Wt 174.8 lb

## 2024-05-07 DIAGNOSIS — K5900 Constipation, unspecified: Secondary | ICD-10-CM | POA: Insufficient documentation

## 2024-05-07 DIAGNOSIS — Z860100 Personal history of colon polyps, unspecified: Secondary | ICD-10-CM | POA: Diagnosis not present

## 2024-05-07 DIAGNOSIS — Z800 Family history of malignant neoplasm of digestive organs: Secondary | ICD-10-CM

## 2024-05-07 MED ORDER — PEG 3350-KCL-NA BICARB-NACL 420 G PO SOLR: 4000.0000 mL | Freq: Once | ORAL | 0 refills | Status: AC

## 2024-05-07 NOTE — Telephone Encounter (Signed)
 TCS w/Dr.Rourk, asa 3 (ok rm 1/2)

## 2024-05-07 NOTE — Patient Instructions (Signed)
 Continue to use your colace as needed for constipation. It is safe to use daily if needed.  Make sure at least one week prior to your colonoscopy, to use colace daily if needed to maintain daily bowel movements.   We will schedule you for a colonoscopy in the near future. Prep instructions to be provided.

## 2024-05-07 NOTE — Telephone Encounter (Signed)
 Attempted to contact pt to schedule procedure. Message states call can not be completed at this time. Please try again later

## 2024-05-07 NOTE — H&P (View-Only) (Signed)
 "    GI Office Note    Referring Provider: Sheryle Carwin, MD Primary Care Physician:  Sheryle Carwin, MD  Primary Gastroenterologist: Ozell Hollingshead, MD   Chief Complaint   Chief Complaint  Patient presents with   Colonoscopy    Colonoscopy screening. Occasional constipation      History of Present Illness   Kimberly Bowen is a 74 y.o. female presenting today to schedule surveillance colonoscopy.   Discussed the use of AI scribe software for clinical note transcription with the patient, who gave verbal consent to proceed.  History of Present Illness Kimberly Bowen is a 74 year old female with colonic polyps who presents for pre-colonoscopy evaluation and management of constipation.  She presents for routine colonoscopy after reporting constipation on a questionnaire. She recalls two or three prior colonoscopies, most recently three years ago, with polyps found and adequate bowel prep. She tolerated prior Trilyte prep without issues.  Constipation is intermittent and controlled with Colace as needed, up to three tablets in 24 hours, used a couple of times per week at most, with a bowel movement the next day. She denies blood in stool, significant abdominal pain, or appetite changes. Heartburn is rare. Occasional gas pain is brief and self-limited. No melena, brbpr.   She has personal history of adenomatous colon polyps. FH colon cancer in paternal grandmother.        Prior Data   Results  Colonoscopy December 2022: -Diverticulosis -ten 3-26mm polyps in the descending colon and cecum removed, cecal polyp was sessile serrated adenoma with no high-grade dysplasia, also a tubular adenoma and hyperplastic polyps removed. -Next colonoscopy in 3 years   Medications   Current Outpatient Medications  Medication Sig Dispense Refill   acetaminophen  (TYLENOL ) 500 MG tablet Take 1,000 mg by mouth as needed for headache.     albuterol (VENTOLIN HFA) 108 (90 Base) MCG/ACT inhaler Inhale 2  puffs into the lungs every 6 (six) hours as needed for shortness of breath.     clobetasol (TEMOVATE) 0.05 % external solution Apply 1 application topically daily as needed (scalp).     hydrochlorothiazide (HYDRODIURIL) 25 MG tablet Take 25 mg by mouth daily.     lisinopril (ZESTRIL) 20 MG tablet Take 20 mg by mouth at bedtime.     PRESCRIPTION MEDICATION Inhale 2 puffs into the lungs in the morning and at bedtime. Trixeo (formoteroli fumaras/dihydricus/glycopyrronium/budesondidum inhaler     No current facility-administered medications for this visit.    Allergies   Allergies as of 05/07/2024   (No Known Allergies)    Past Medical History   Past Medical History:  Diagnosis Date   Anxiety    Colon polyps    multiple hyperplastic polyps   COPD (chronic obstructive pulmonary disease) (HCC)    Eczema    Hyperlipidemia    not on med   Hypertension    Seasonal allergies     Past Surgical History   Past Surgical History:  Procedure Laterality Date   CATARACT EXTRACTION W/PHACO Left 09/09/2019   Procedure: CATARACT EXTRACTION PHACO AND INTRAOCULAR LENS PLACEMENT (IOC) CDE:  8.15;  Surgeon: Harrie Agent, MD;  Location: [facility];  Service: Ophthalmology;  Laterality: Left;   CATARACT EXTRACTION W/PHACO Right 09/23/2019   Procedure: CATARACT EXTRACTION PHACO AND INTRAOCULAR LENS PLACEMENT RIGHT EYE;  Surgeon: Harrie Agent, MD;  Location: [facility];  Service: Ophthalmology;  Laterality: Right;  CDE: 7.94   COLONOSCOPY  09/26/2006     MFM:Wnmfjo rectum, pan colonic diverticula/Multiple  colonic polyps removed and/or destroyed, as described above (hyperplastic)   COLONOSCOPY N/A 02/11/2014   Procedure: COLONOSCOPY;  Surgeon: Lamar CHRISTELLA Hollingshead, MD;  Location: [facility];  Service: Endoscopy;  Laterality: N/A;  1:00    COLONOSCOPY N/A 04/14/2021   Procedure: COLONOSCOPY;  Surgeon: Hollingshead Lamar CHRISTELLA, MD;  Location: [facility];  Service: Endoscopy;  Laterality: N/A;  2:00   POLYPECTOMY   04/14/2021   Procedure: POLYPECTOMY;  Surgeon: Hollingshead Lamar CHRISTELLA, MD;  Location: [facility];  Service: Endoscopy;;   VAGINAL HYSTERECTOMY      Past Family History   Family History  Problem Relation Age of Onset   Colon cancer Other    Leukemia Mother    Lupus Sister    Breast cancer Maternal Aunt        age 61   Cancer Cousin        three first cousins died of cancer    Past Social History   Social History   Socioeconomic History   Marital status: Married    Spouse name: Not on file   Number of children: 2   Years of education: Not on file   Highest education level: Not on file  Occupational History    Employer: [employer]  Tobacco Use   Smoking status: Every Day    Current packs/day: 1.00    Average packs/day: 1 pack/day for 40.0 years (40.0 ttl pk-yrs)    Types: Cigarettes   Smokeless tobacco: Never   Tobacco comments:    1/2 pack daily  Substance and Sexual Activity   Alcohol use: No   Drug use: No   Sexual activity: Not on file  Other Topics Concern   Not on file  Social History Narrative   Not on file   Social Drivers of Health   Tobacco Use: High Risk (05/07/2024)   Patient History    Smoking Tobacco Use: Every Day    Smokeless Tobacco Use: Never    Passive Exposure: Not on file  Financial Resource Strain: Not on file  Food Insecurity: Not on file  Transportation Needs: Not on file  Physical Activity: Not on file  Stress: Not on file  Social Connections: Not on file  Intimate Partner Violence: Not on file  Depression (EYV7-0): Not on file  Alcohol Screen: Not on file  Housing: Not on file  Utilities: Not on file  Health Literacy: Not on file    Review of Systems   General: Negative for anorexia, weight loss, fever, chills, fatigue, weakness. Eyes: Negative for vision changes.  ENT: Negative for hoarseness, difficulty swallowing , nasal congestion. CV: Negative for chest pain, angina, palpitations, dyspnea on exertion, peripheral edema.   Respiratory: Negative for dyspnea at rest, dyspnea on exertion, +cough, sputum, +wheezing.  GI: See history of present illness. GU:  Negative for dysuria, hematuria, urinary incontinence, urinary frequency, nocturnal urination.  MS: Negative for joint pain, low back pain.  Derm: Negative for rash or itching.  Neuro: Negative for weakness, abnormal sensation, seizure, frequent headaches, memory loss,  confusion.  Psych: Negative for anxiety, depression, suicidal ideation, hallucinations.  Endo: Negative for unusual weight change.  Heme: Negative for bruising or bleeding. Allergy: Negative for rash or hives.  Physical Exam   BP 111/72 (BP Location: Right Arm, Patient Position: Sitting, Cuff Size: Large)   Pulse 86   Temp 97.6 F (36.4 C) (Temporal)   Ht 5' 2 (1.575 m)   Wt 174 lb 12.8 oz (79.3 kg)  BMI 31.97 kg/m    General: Well-nourished, well-developed in no acute distress.  Head: Normocephalic, atraumatic.   Eyes: Conjunctiva pink, no icterus. Mouth: Oropharyngeal mucosa moist and pink  Neck: Supple without thyromegaly, masses, or lymphadenopathy.  Lungs: Clear to auscultation bilaterally.  Heart: Regular rate and rhythm, no murmurs rubs or gallops.  Abdomen: Bowel sounds are normal, nontender, nondistended, no hepatosplenomegaly or masses,  no abdominal bruits or hernia, no rebound or guarding.   Rectal: not performed Extremities: No lower extremity edema. No clubbing or deformities.  Neuro: Alert and oriented x 4 , grossly normal neurologically.  Skin: Warm and dry, no rash or jaundice.   Psych: Alert and cooperative, normal mood and affect.  Labs   08/2023: WBC 6.4, Hgb 16.2, Plt 344, Cre 0.8, LFTs normal Imaging Studies   No results found.  Assessment/Plan:    Assessment & Plan Colorectal cancer screening in patient with personal history of colonic polyps, FH colon cancer in paternal grandmother. Due for surveillance colonoscopy.  based on prior colonic polyp  findings.  - Scheduled colonoscopy with Dr. Shaaron. ASA 3, room 1,2 ok.  I have discussed the risks, alternatives, benefits with regards to but not limited to the risk of reaction to medication, bleeding, infection, perforation and the patient is agreeable to proceed. Written consent to be obtained.     Constipation Mild, intermittent constipation controlled with as-needed docusate sodium. - Advised monitoring bowel movements in the week prior to colonoscopy and use docusate as needed for daily bowel movements. - Instructed to contact office with questions or concerns regarding bowel habits or preparation prior to procedure.            Sonny RAMAN. Ezzard, MHS, PA-C [facility]  "

## 2024-05-07 NOTE — Progress Notes (Signed)
 "    GI Office Note    Referring Provider: Sheryle Carwin, MD Primary Care Physician:  Sheryle Carwin, MD  Primary Gastroenterologist: Ozell Hollingshead, MD   Chief Complaint   Chief Complaint  Patient presents with   Colonoscopy    Colonoscopy screening. Occasional constipation      History of Present Illness   Kimberly Bowen is a 74 y.o. female presenting today to schedule surveillance colonoscopy.   Discussed the use of AI scribe software for clinical note transcription with the patient, who gave verbal consent to proceed.  History of Present Illness Kimberly Bowen is a 74 year old female with colonic polyps who presents for pre-colonoscopy evaluation and management of constipation.  She presents for routine colonoscopy after reporting constipation on a questionnaire. She recalls two or three prior colonoscopies, most recently three years ago, with polyps found and adequate bowel prep. She tolerated prior Trilyte prep without issues.  Constipation is intermittent and controlled with Colace as needed, up to three tablets in 24 hours, used a couple of times per week at most, with a bowel movement the next day. She denies blood in stool, significant abdominal pain, or appetite changes. Heartburn is rare. Occasional gas pain is brief and self-limited. No melena, brbpr.   She has personal history of adenomatous colon polyps. FH colon cancer in paternal grandmother.        Prior Data   Results  Colonoscopy December 2022: -Diverticulosis -ten 3-26mm polyps in the descending colon and cecum removed, cecal polyp was sessile serrated adenoma with no high-grade dysplasia, also a tubular adenoma and hyperplastic polyps removed. -Next colonoscopy in 3 years   Medications   Current Outpatient Medications  Medication Sig Dispense Refill   acetaminophen  (TYLENOL ) 500 MG tablet Take 1,000 mg by mouth as needed for headache.     albuterol (VENTOLIN HFA) 108 (90 Base) MCG/ACT inhaler Inhale 2  puffs into the lungs every 6 (six) hours as needed for shortness of breath.     clobetasol (TEMOVATE) 0.05 % external solution Apply 1 application topically daily as needed (scalp).     hydrochlorothiazide (HYDRODIURIL) 25 MG tablet Take 25 mg by mouth daily.     lisinopril (ZESTRIL) 20 MG tablet Take 20 mg by mouth at bedtime.     PRESCRIPTION MEDICATION Inhale 2 puffs into the lungs in the morning and at bedtime. Trixeo (formoteroli fumaras/dihydricus/glycopyrronium/budesondidum inhaler     No current facility-administered medications for this visit.    Allergies   Allergies as of 05/07/2024   (No Known Allergies)    Past Medical History   Past Medical History:  Diagnosis Date   Anxiety    Colon polyps    multiple hyperplastic polyps   COPD (chronic obstructive pulmonary disease) (HCC)    Eczema    Hyperlipidemia    not on med   Hypertension    Seasonal allergies     Past Surgical History   Past Surgical History:  Procedure Laterality Date   CATARACT EXTRACTION W/PHACO Left 09/09/2019   Procedure: CATARACT EXTRACTION PHACO AND INTRAOCULAR LENS PLACEMENT (IOC) CDE:  8.15;  Surgeon: Harrie Agent, MD;  Location: [facility];  Service: Ophthalmology;  Laterality: Left;   CATARACT EXTRACTION W/PHACO Right 09/23/2019   Procedure: CATARACT EXTRACTION PHACO AND INTRAOCULAR LENS PLACEMENT RIGHT EYE;  Surgeon: Harrie Agent, MD;  Location: [facility];  Service: Ophthalmology;  Laterality: Right;  CDE: 7.94   COLONOSCOPY  09/26/2006     MFM:Wnmfjo rectum, pan colonic diverticula/Multiple  colonic polyps removed and/or destroyed, as described above (hyperplastic)   COLONOSCOPY N/A 02/11/2014   Procedure: COLONOSCOPY;  Surgeon: Lamar CHRISTELLA Hollingshead, MD;  Location: [facility];  Service: Endoscopy;  Laterality: N/A;  1:00    COLONOSCOPY N/A 04/14/2021   Procedure: COLONOSCOPY;  Surgeon: Hollingshead Lamar CHRISTELLA, MD;  Location: [facility];  Service: Endoscopy;  Laterality: N/A;  2:00   POLYPECTOMY   04/14/2021   Procedure: POLYPECTOMY;  Surgeon: Hollingshead Lamar CHRISTELLA, MD;  Location: [facility];  Service: Endoscopy;;   VAGINAL HYSTERECTOMY      Past Family History   Family History  Problem Relation Age of Onset   Colon cancer Other    Leukemia Mother    Lupus Sister    Breast cancer Maternal Aunt        age 61   Cancer Cousin        three first cousins died of cancer    Past Social History   Social History   Socioeconomic History   Marital status: Married    Spouse name: Not on file   Number of children: 2   Years of education: Not on file   Highest education level: Not on file  Occupational History    Employer: [employer]  Tobacco Use   Smoking status: Every Day    Current packs/day: 1.00    Average packs/day: 1 pack/day for 40.0 years (40.0 ttl pk-yrs)    Types: Cigarettes   Smokeless tobacco: Never   Tobacco comments:    1/2 pack daily  Substance and Sexual Activity   Alcohol use: No   Drug use: No   Sexual activity: Not on file  Other Topics Concern   Not on file  Social History Narrative   Not on file   Social Drivers of Health   Tobacco Use: High Risk (05/07/2024)   Patient History    Smoking Tobacco Use: Every Day    Smokeless Tobacco Use: Never    Passive Exposure: Not on file  Financial Resource Strain: Not on file  Food Insecurity: Not on file  Transportation Needs: Not on file  Physical Activity: Not on file  Stress: Not on file  Social Connections: Not on file  Intimate Partner Violence: Not on file  Depression (EYV7-0): Not on file  Alcohol Screen: Not on file  Housing: Not on file  Utilities: Not on file  Health Literacy: Not on file    Review of Systems   General: Negative for anorexia, weight loss, fever, chills, fatigue, weakness. Eyes: Negative for vision changes.  ENT: Negative for hoarseness, difficulty swallowing , nasal congestion. CV: Negative for chest pain, angina, palpitations, dyspnea on exertion, peripheral edema.   Respiratory: Negative for dyspnea at rest, dyspnea on exertion, +cough, sputum, +wheezing.  GI: See history of present illness. GU:  Negative for dysuria, hematuria, urinary incontinence, urinary frequency, nocturnal urination.  MS: Negative for joint pain, low back pain.  Derm: Negative for rash or itching.  Neuro: Negative for weakness, abnormal sensation, seizure, frequent headaches, memory loss,  confusion.  Psych: Negative for anxiety, depression, suicidal ideation, hallucinations.  Endo: Negative for unusual weight change.  Heme: Negative for bruising or bleeding. Allergy: Negative for rash or hives.  Physical Exam   BP 111/72 (BP Location: Right Arm, Patient Position: Sitting, Cuff Size: Large)   Pulse 86   Temp 97.6 F (36.4 C) (Temporal)   Ht 5' 2 (1.575 m)   Wt 174 lb 12.8 oz (79.3 kg)  BMI 31.97 kg/m    General: Well-nourished, well-developed in no acute distress.  Head: Normocephalic, atraumatic.   Eyes: Conjunctiva pink, no icterus. Mouth: Oropharyngeal mucosa moist and pink  Neck: Supple without thyromegaly, masses, or lymphadenopathy.  Lungs: Clear to auscultation bilaterally.  Heart: Regular rate and rhythm, no murmurs rubs or gallops.  Abdomen: Bowel sounds are normal, nontender, nondistended, no hepatosplenomegaly or masses,  no abdominal bruits or hernia, no rebound or guarding.   Rectal: not performed Extremities: No lower extremity edema. No clubbing or deformities.  Neuro: Alert and oriented x 4 , grossly normal neurologically.  Skin: Warm and dry, no rash or jaundice.   Psych: Alert and cooperative, normal mood and affect.  Labs   08/2023: WBC 6.4, Hgb 16.2, Plt 344, Cre 0.8, LFTs normal Imaging Studies   No results found.  Assessment/Plan:    Assessment & Plan Colorectal cancer screening in patient with personal history of colonic polyps, FH colon cancer in paternal grandmother. Due for surveillance colonoscopy.  based on prior colonic polyp  findings.  - Scheduled colonoscopy with Dr. Shaaron. ASA 3, room 1,2 ok.  I have discussed the risks, alternatives, benefits with regards to but not limited to the risk of reaction to medication, bleeding, infection, perforation and the patient is agreeable to proceed. Written consent to be obtained.     Constipation Mild, intermittent constipation controlled with as-needed docusate sodium. - Advised monitoring bowel movements in the week prior to colonoscopy and use docusate as needed for daily bowel movements. - Instructed to contact office with questions or concerns regarding bowel habits or preparation prior to procedure.            Kimberly Bowen. Ezzard, MHS, PA-C [facility]  "

## 2024-05-14 ENCOUNTER — Encounter (HOSPITAL_COMMUNITY)
Admission: RE | Admit: 2024-05-14 | Discharge: 2024-05-14 | Disposition: A | Discharge status: Home / Self Care | Source: Ambulatory Visit | Attending: Internal Medicine | Admitting: Internal Medicine

## 2024-05-14 NOTE — Pre-Procedure Instructions (Signed)
Attempted pre-op phone call. Left Vm for her to call us back.

## 2024-05-15 ENCOUNTER — Other Ambulatory Visit: Payer: Self-pay

## 2024-05-15 ENCOUNTER — Encounter (HOSPITAL_COMMUNITY): Payer: Self-pay

## 2024-05-16 ENCOUNTER — Ambulatory Visit (HOSPITAL_COMMUNITY)
Admission: RE | Admit: 2024-05-16 | Discharge: 2024-05-16 | Disposition: A | Discharge status: Home / Self Care | Attending: Internal Medicine | Admitting: Internal Medicine

## 2024-05-16 ENCOUNTER — Encounter (HOSPITAL_COMMUNITY)
Admission: RE | Disposition: A | Discharge status: Home / Self Care | Payer: Self-pay | Source: Home / Self Care | Attending: Internal Medicine

## 2024-05-16 ENCOUNTER — Ambulatory Visit (HOSPITAL_COMMUNITY): Admitting: Anesthesiology

## 2024-05-16 ENCOUNTER — Encounter (HOSPITAL_COMMUNITY): Payer: Self-pay | Admitting: Internal Medicine

## 2024-05-16 DIAGNOSIS — K5730 Diverticulosis of large intestine without perforation or abscess without bleeding: Secondary | ICD-10-CM | POA: Insufficient documentation

## 2024-05-16 DIAGNOSIS — K635 Polyp of colon: Secondary | ICD-10-CM | POA: Diagnosis not present

## 2024-05-16 DIAGNOSIS — Z1211 Encounter for screening for malignant neoplasm of colon: Secondary | ICD-10-CM | POA: Insufficient documentation

## 2024-05-16 DIAGNOSIS — Z860100 Personal history of colon polyps, unspecified: Secondary | ICD-10-CM | POA: Diagnosis not present

## 2024-05-16 DIAGNOSIS — I10 Essential (primary) hypertension: Secondary | ICD-10-CM | POA: Diagnosis not present

## 2024-05-16 DIAGNOSIS — Z800 Family history of malignant neoplasm of digestive organs: Secondary | ICD-10-CM | POA: Diagnosis not present

## 2024-05-16 DIAGNOSIS — D124 Benign neoplasm of descending colon: Secondary | ICD-10-CM | POA: Insufficient documentation

## 2024-05-16 DIAGNOSIS — F17210 Nicotine dependence, cigarettes, uncomplicated: Secondary | ICD-10-CM | POA: Diagnosis not present

## 2024-05-16 DIAGNOSIS — K5900 Constipation, unspecified: Secondary | ICD-10-CM | POA: Diagnosis not present

## 2024-05-16 DIAGNOSIS — J449 Chronic obstructive pulmonary disease, unspecified: Secondary | ICD-10-CM | POA: Diagnosis not present

## 2024-05-16 HISTORY — PX: POLYPECTOMY: SHX149

## 2024-05-16 HISTORY — PX: COLONOSCOPY: SHX5424

## 2024-05-16 MED ORDER — PROPOFOL 10 MG/ML IV BOLUS
INTRAVENOUS | Status: DC | PRN
  Administered 2024-05-16: 50 mg via INTRAVENOUS

## 2024-05-16 MED ORDER — LIDOCAINE 2% (20 MG/ML) 5 ML SYRINGE
INTRAMUSCULAR | Status: DC | PRN
  Administered 2024-05-16: 80 mg via INTRAVENOUS

## 2024-05-16 MED ORDER — PROPOFOL 500 MG/50ML IV EMUL
INTRAVENOUS | Status: DC | PRN
  Administered 2024-05-16: 150 ug/kg/min via INTRAVENOUS

## 2024-05-16 MED ORDER — LACTATED RINGERS IV SOLN: INTRAVENOUS | Status: DC

## 2024-05-16 NOTE — Discharge Instructions (Addendum)
" °  Colonoscopy Discharge Instructions  Read the instructions outlined below and refer to this sheet in the next few weeks. These discharge instructions provide you with general information on caring for yourself after you leave the hospital. Your doctor may also give you specific instructions. While your treatment has been planned according to the most current medical practices available, unavoidable complications occasionally occur. If you have any problems or questions after discharge, call Dr. Shaaron at [phone]. ACTIVITY You may resume your regular activity, but move at a slower pace for the next 24 hours.  Take frequent rest periods for the next 24 hours.  Walking will help get rid of the air and reduce the bloated feeling in your belly (abdomen).  No driving for 24 hours (because of the medicine (anesthesia) used during the test).   Do not sign any important legal documents or operate any machinery for 24 hours (because of the anesthesia used during the test).  NUTRITION Drink plenty of fluids.  You may resume your normal diet as instructed by your doctor.  Begin with a light meal and progress to your normal diet. Heavy or fried foods are harder to digest and may make you feel sick to your stomach (nauseated).  Avoid alcoholic beverages for 24 hours or as instructed.  MEDICATIONS You may resume your normal medications unless your doctor tells you otherwise.  WHAT YOU CAN EXPECT TODAY Some feelings of bloating in the abdomen.  Passage of more gas than usual.  Spotting of blood in your stool or on the toilet paper.  IF YOU HAD POLYPS REMOVED DURING THE COLONOSCOPY: No aspirin products for 7 days or as instructed.  No alcohol for 7 days or as instructed.  Eat a soft diet for the next 24 hours.  FINDING OUT THE RESULTS OF YOUR TEST Not all test results are available during your visit. If your test results are not back during the visit, make an appointment with your caregiver to find out the  results. Do not assume everything is normal if you have not heard from your caregiver or the medical facility. It is important for you to follow up on all of your test results.  SEEK IMMEDIATE MEDICAL ATTENTION IF: You have more than a spotting of blood in your stool.  Your belly is swollen (abdominal distention).  You are nauseated or vomiting.  You have a temperature over 101.  You have abdominal pain or discomfort that is severe or gets worse throughout the day.       1 polyp found and removed  Diverticulosis present throughout your colon   At Patient request, called Linetta Regner at [phone] reviewed findings and recommendations "

## 2024-05-16 NOTE — Op Note (Signed)
 [facility] Patient Name: Kimberly Bowen Procedure Date: 05/16/2024 9:04 AM MRN: [id number] Date of Birth: 1950-08-11 Attending MD: Lamar Ozell Hollingshead , MD, [id number] CSN: [id number] Age: 74 Admit Type: Outpatient Procedure:                Colonoscopy Indications:              High risk colon cancer surveillance: Personal                            history of colonic polyps Providers:                Lamar Ozell Hollingshead, MD, Jon LABOR. Gerome RN, RN,                            Dorcas Lenis, Technician Referring MD:             Lamar Ozell Hollingshead, MD Medicines:                Propofol  per Anesthesia Complications:            No immediate complications. Estimated Blood Loss:     Estimated blood loss was minimal. Estimated blood                            loss: none. Procedure:                Pre-Anesthesia Assessment:                           - Prior to the procedure, a History and Physical                            was performed, and patient medications and                            allergies were reviewed. The patient's tolerance of                            previous anesthesia was also reviewed. The risks                            and benefits of the procedure and the sedation                            options and risks were discussed with the patient.                            All questions were answered, and informed consent                            was obtained. Prior Anticoagulants: The patient has                            taken no anticoagulant or antiplatelet agents. ASA  Grade Assessment: III - A patient with severe                            systemic disease. After reviewing the risks and                            benefits, the patient was deemed in satisfactory                            condition to undergo the procedure.                           After obtaining informed consent, the colonoscope                            was  passed under direct vision. Throughout the                            procedure, the patient's blood pressure, pulse, and                            oxygen saturations were monitored continuously. The                            CH-[id number] ([id number]) Colon was introduced through                            the anus and advanced to the the cecum, identified                            by appendiceal orifice and ileocecal valve. The                            colonoscopy was performed without difficulty. The                            patient tolerated the procedure well. The quality                            of the bowel preparation was adequate. The                            ileocecal valve, appendiceal orifice, and rectum                            were photographed. Scope In: 9:25:51 AM Scope Out: 9:45:24 AM Scope Withdrawal Time: 0 hours 9 minutes 49 seconds  Total Procedure Duration: 0 hours 19 minutes 33 seconds  Findings:      The perianal and digital rectal examinations were normal.      Multiple medium-mouthed diverticula were found in the entire colon.      A 5 mm polyp was found in the descending colon. The polyp was sessile.       The polyp was removed with a cold snare. Resection and retrieval  were       complete. Estimated blood loss was minimal.      The exam was otherwise without abnormality. Rectal mucosa seen well.       Rectal vault too small to retroflex. Impression:               - Diverticulosis in the entire examined colon.                           - One 5 mm polyp in the descending colon, removed                            with a cold snare. Resected and retrieved.                           - The examination was otherwise normal. Moderate Sedation:      Moderate (conscious) sedation was personally administered by an       anesthesia professional. The following parameters were monitored: oxygen       saturation, heart rate, blood pressure, respiratory rate, EKG,  adequacy       of pulmonary ventilation, and response to care. Recommendation:           - Patient has a contact number available for                            emergencies. The signs and symptoms of potential                            delayed complications were discussed with the                            patient. Return to normal activities tomorrow.                            Written discharge instructions were provided to the                            patient.                           - Advance diet as tolerated. Follow-up on                            pathology. Further recommendations to follow. Procedure Code(s):        --- Professional ---                           [phone], Colonoscopy, flexible; with removal of                            tumor(s), polyp(s), or other lesion(s) by snare                            technique Diagnosis Code(s):        --- Professional ---  Z86.010, Personal history of colonic polyps                           D12.4, Benign neoplasm of descending colon                           K57.30, Diverticulosis of large intestine without                            perforation or abscess without bleeding CPT copyright 2022 [employer]. All rights reserved. The codes documented in this report are preliminary and upon coder review may  be revised to meet current compliance requirements. Lamar HERO. Abagale Boulos, MD Lamar Ozell Hollingshead, MD 05/16/2024 9:55:51 AM This report has been signed electronically. Number of Addenda: 0

## 2024-05-16 NOTE — Anesthesia Preprocedure Evaluation (Signed)
"                                    Anesthesia Evaluation  Patient identified by MRN, date of birth, ID band Patient awake    Reviewed: Allergy & Precautions, H&P , NPO status , Patient's Chart, lab work & pertinent test results, reviewed documented beta blocker date and time   Airway Mallampati: II  TM Distance: >3 FB Neck ROM: full    Dental no notable dental hx.    Pulmonary COPD, Current Smoker   Pulmonary exam normal breath sounds clear to auscultation       Cardiovascular Exercise Tolerance: Good hypertension,  Rhythm:regular Rate:Normal     Neuro/Psych   Anxiety     negative neurological ROS  negative psych ROS   GI/Hepatic negative GI ROS, Neg liver ROS,,,  Endo/Other  negative endocrine ROS    Renal/GU negative Renal ROS  negative genitourinary   Musculoskeletal   Abdominal   Peds  Hematology negative hematology ROS (+)   Anesthesia Other Findings   Reproductive/Obstetrics negative OB ROS                              Anesthesia Physical Anesthesia Plan  ASA: 2  Anesthesia Plan: MAC   Post-op Pain Management:    Induction:   PONV Risk Score and Plan: Propofol  infusion  Airway Management Planned:   Additional Equipment:   Intra-op Plan:   Post-operative Plan:   Informed Consent: I have reviewed the patients History and Physical, chart, labs and discussed the procedure including the risks, benefits and alternatives for the proposed anesthesia with the patient or authorized representative who has indicated his/her understanding and acceptance.     Dental Advisory Given  Plan Discussed with: CRNA  Anesthesia Plan Comments:         Anesthesia Quick Evaluation  "

## 2024-05-16 NOTE — Interval H&P Note (Signed)
 History and Physical Interval Note:  05/16/2024 9:09 AM  Kimberly Bowen  has presented today for surgery, with the diagnosis of yk:rnonw polyps, QYK:rnonw cancer.  The various methods of treatment have been discussed with the patient and family. After consideration of risks, benefits and other options for treatment, the patient has consented to  Procedures with comments: COLONOSCOPY (N/A) - 9:00 am, asa 3 (ok rm 1/2) as a surgical intervention.  The patient's history has been reviewed, patient examined, no change in status, stable for surgery.  I have reviewed the patient's chart and labs.  Questions were answered to the patient's satisfaction.     Kimberly Bowen   no change.  Surveillance colonoscopy today per plan.  The risks, benefits, limitations, alternatives and imponderables have been reviewed with the patient. Questions have been answered. All parties are agreeable.

## 2024-05-16 NOTE — Transfer of Care (Signed)
 Immediate Anesthesia Transfer of Care Note  Patient: Kimberly Bowen  Procedure(s) Performed: COLONOSCOPY POLYPECTOMY, INTESTINE  Patient Location: Short Stay  Anesthesia Type:General  Level of Consciousness: awake, alert , oriented, and patient cooperative  Airway & Oxygen Therapy: Patient Spontanous Breathing  Post-op Assessment: Report given to RN, Post -op Vital signs reviewed and stable, and Patient moving all extremities X 4  Post vital signs: Reviewed and stable  Last Vitals:  Vitals Value Taken Time  BP 95/45 05/16/24 09:51  Temp 36.5 C 05/16/24 09:51  Pulse 73 05/16/24 09:51  Resp 16 05/16/24 09:51  SpO2 100 % 05/16/24 09:51    Last Pain:  Vitals:   05/16/24 0951  TempSrc: Oral  PainSc: 0-No pain         Complications: No notable events documented.

## 2024-05-17 ENCOUNTER — Encounter (HOSPITAL_COMMUNITY): Payer: Self-pay | Admitting: Internal Medicine

## 2024-05-17 LAB — SURGICAL PATHOLOGY

## 2024-05-17 NOTE — Anesthesia Postprocedure Evaluation (Signed)
"   Anesthesia Post Note  Patient: Kimberly Bowen  Procedure(s) Performed: COLONOSCOPY POLYPECTOMY, INTESTINE  Patient location during evaluation: Phase II Anesthesia Type: MAC Level of consciousness: awake Pain management: pain level controlled Vital Signs Assessment: post-procedure vital signs reviewed and stable Respiratory status: spontaneous breathing and respiratory function stable Cardiovascular status: blood pressure returned to baseline and stable Postop Assessment: no headache and no apparent nausea or vomiting Anesthetic complications: no Comments: Late entry   No notable events documented.   Last Vitals:  Vitals:   05/16/24 0800 05/16/24 0951  BP: (!) 135/54 (!) 95/45  Pulse:  73  Resp: 14 16  Temp: 36.7 C 36.5 C  SpO2: 98% 100%    Last Pain:  Vitals:   05/16/24 0951  TempSrc: Oral  PainSc: 0-No pain                 Yvonna PARAS Leroy Pettway      "

## 2024-05-20 ENCOUNTER — Ambulatory Visit: Payer: Self-pay | Admitting: Internal Medicine
